# Patient Record
Sex: Female | Born: 1980 | Race: White | Hispanic: No | Marital: Single
Health system: Southern US, Community
[De-identification: ages and names within clinical notes are randomized; demographics above are authoritative.]

## PROBLEM LIST (undated history)

## (undated) DIAGNOSIS — S82892A Other fracture of left lower leg, initial encounter for closed fracture: Secondary | ICD-10-CM

## (undated) DIAGNOSIS — F32A Depression, unspecified: Secondary | ICD-10-CM

## (undated) DIAGNOSIS — I509 Heart failure, unspecified: Secondary | ICD-10-CM

## (undated) DIAGNOSIS — I10 Essential (primary) hypertension: Secondary | ICD-10-CM

## (undated) DIAGNOSIS — F319 Bipolar disorder, unspecified: Secondary | ICD-10-CM

## (undated) DIAGNOSIS — E559 Vitamin D deficiency, unspecified: Secondary | ICD-10-CM

## (undated) DIAGNOSIS — K760 Fatty (change of) liver, not elsewhere classified: Secondary | ICD-10-CM

## (undated) DIAGNOSIS — E669 Obesity, unspecified: Secondary | ICD-10-CM

## (undated) DIAGNOSIS — K219 Gastro-esophageal reflux disease without esophagitis: Secondary | ICD-10-CM

## (undated) DIAGNOSIS — G43909 Migraine, unspecified, not intractable, without status migrainosus: Secondary | ICD-10-CM

## (undated) DIAGNOSIS — F329 Major depressive disorder, single episode, unspecified: Secondary | ICD-10-CM

## (undated) HISTORY — PX: TUBAL LIGATION: SHX77

## (undated) HISTORY — PX: MOUTH SURGERY: SHX715

## (undated) HISTORY — PX: OTHER SURGICAL HISTORY: SHX169

## (undated) HISTORY — PX: LYMPH GLAND EXCISION: SHX13

## (undated) HISTORY — PX: DILATION AND CURETTAGE OF UTERUS: SHX78

---

## 2004-10-05 ENCOUNTER — Ambulatory Visit (HOSPITAL_COMMUNITY): Admission: RE | Admit: 2004-10-05 | Discharge: 2004-10-05 | Payer: Self-pay | Admitting: Obstetrics and Gynecology

## 2004-10-05 ENCOUNTER — Other Ambulatory Visit: Admission: RE | Admit: 2004-10-05 | Discharge: 2004-10-05 | Payer: Self-pay | Admitting: Obstetrics and Gynecology

## 2010-04-12 ENCOUNTER — Emergency Department: Payer: Self-pay | Admitting: Emergency Medicine

## 2011-03-06 ENCOUNTER — Emergency Department: Payer: Self-pay | Admitting: Emergency Medicine

## 2011-05-10 ENCOUNTER — Encounter (HOSPITAL_COMMUNITY): Payer: Self-pay | Admitting: Emergency Medicine

## 2011-05-10 ENCOUNTER — Emergency Department (HOSPITAL_COMMUNITY): Payer: Self-pay

## 2011-05-10 ENCOUNTER — Emergency Department (HOSPITAL_COMMUNITY)
Admission: EM | Admit: 2011-05-10 | Discharge: 2011-05-11 | Disposition: A | Discharge status: Home / Self Care | Payer: Self-pay | Attending: Emergency Medicine | Admitting: Emergency Medicine

## 2011-05-10 DIAGNOSIS — S39012A Strain of muscle, fascia and tendon of lower back, initial encounter: Secondary | ICD-10-CM

## 2011-05-10 DIAGNOSIS — Y92009 Unspecified place in unspecified non-institutional (private) residence as the place of occurrence of the external cause: Secondary | ICD-10-CM | POA: Insufficient documentation

## 2011-05-10 DIAGNOSIS — S335XXA Sprain of ligaments of lumbar spine, initial encounter: Secondary | ICD-10-CM | POA: Insufficient documentation

## 2011-05-10 DIAGNOSIS — W010XXA Fall on same level from slipping, tripping and stumbling without subsequent striking against object, initial encounter: Secondary | ICD-10-CM | POA: Insufficient documentation

## 2011-05-10 DIAGNOSIS — M545 Low back pain, unspecified: Secondary | ICD-10-CM | POA: Insufficient documentation

## 2011-05-10 HISTORY — DX: Essential (primary) hypertension: I10

## 2011-05-10 NOTE — ED Notes (Signed)
PT. 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 PAIN WORSE WITH MOVEMENT AND CERTAIN POSITIONS.

## 2011-05-11 ENCOUNTER — Encounter (HOSPITAL_COMMUNITY): Payer: Self-pay | Admitting: Emergency Medicine

## 2011-05-11 MED ORDER — OXYCODONE-ACETAMINOPHEN 5-325 MG PO TABS: ORAL_TABLET | ORAL | Status: DC

## 2011-05-11 MED ORDER — OXYCODONE-ACETAMINOPHEN 5-325 MG PO TABS
1.0000 | ORAL_TABLET | Freq: Once | ORAL | Status: AC
  Administered 2011-05-11: 1 via ORAL
  Filled 2011-05-11: qty 1

## 2011-05-11 NOTE — Discharge Instructions (Signed)
Back Pain, Adult Low back pain is very common. About 1 in 5 people have back pain.The cause of low back pain is rarely dangerous. The pain often gets better over time.About half of people with a sudden onset of back pain feel better in just 2 weeks. About 8 in 10 people feel better by 6 weeks.  CAUSES Some common causes of back pain include:  Strain of the muscles or ligaments supporting the spine.   Wear and tear (degeneration) of the spinal discs.   Arthritis.   Direct injury to the back.  DIAGNOSIS Most of the time, the direct cause of low back pain is not known.However, back pain can be treated effectively even when the exact cause of the pain is unknown.Answering your caregiver's questions about your overall health and symptoms is one of the most accurate ways to make sure the cause of your pain is not dangerous. If your caregiver needs more information, he or she may order lab work or imaging tests (X-rays or MRIs).However, even if imaging tests show changes in your back, this usually does not require surgery. HOME CARE INSTRUCTIONS For many people, back pain returns.Since low back pain is rarely dangerous, it is often a condition that people can learn to manageon their own.   Remain active. It is stressful on the back to sit or stand in one place. Do not sit, drive, or stand in one place for more than 30 minutes at a time. Take short walks on level surfaces as soon as pain allows.Try to increase the length of time you walk each day.   Do not stay in bed.Resting more than 1 or 2 days can delay your recovery.   Do not avoid exercise or work.Your body is made to move.It is not dangerous to be active, even though your back may hurt.Your back will likely heal faster if you return to being active before your pain is gone.   Pay attention to your body when you bend and lift. Many people have less discomfortwhen lifting if they bend their knees, keep the load close to their  bodies,and avoid twisting. Often, the most comfortable positions are those that put less stress on your recovering back.   Find a comfortable position to sleep. Use a firm mattress and lie on your side with your knees slightly bent. If you lie on your back, put a pillow under your knees.   Only take over-the-counter or prescription medicines as directed by your caregiver. Over-the-counter medicines to reduce pain and inflammation are often the most helpful.Your caregiver may prescribe muscle relaxant drugs.These medicines help dull your pain so you can more quickly return to your normal activities and healthy exercise.   Put ice on the injured area.   Put ice in a plastic bag.   Place a towel between your skin and the bag.   Leave the ice on for 15 to 20 minutes, 3 to 4 times a day for the first 2 to 3 days. After that, ice and heat may be alternated to reduce pain and spasms.   Ask your caregiver about trying back exercises and gentle massage. This may be of some benefit.   Avoid feeling anxious or stressed.Stress increases muscle tension and can worsen back pain.It is important to recognize when you are anxious or stressed and learn ways to manage it.Exercise is a great option.  SEEK MEDICAL CARE IF:  You have pain that is not relieved with rest or medicine.   You have   pain that does not improve in 1 week.   You have new symptoms.   You are generally not feeling well.  SEEK IMMEDIATE MEDICAL CARE IF:   You have pain that radiates from your back into your legs.   You develop new bowel or bladder control problems.   You have unusual weakness or numbness in your arms or legs.   You develop nausea or vomiting.   You develop abdominal pain.   You feel faint.  Document Released: 01/31/2005 Document Revised: 01/20/2011 Document Reviewed: 06/21/2010 [facility] Patient Information 2012 [place], [place].    Narcotic and benzodiazepine use may cause drowsiness, slowed breathing  or dependence.  Please use with caution and do not drive, operate machinery or watch young children alone while taking them.  Taking combinations of these medications or drinking alcohol will potentiate these effects.  Be sure to take Aleve or ibuprofen along with food for the next 5 days, ice packs and heating pads will also help alleviate muscle spasms and pain as well.  Be sure to perform stretching and strength exercises once you are able to to help strengthen your back to help prevent future problems.

## 2011-05-11 NOTE — ED Notes (Signed)
Awaiting patient's ride to release patient.

## 2011-05-11 NOTE — ED Provider Notes (Signed)
History     CSN: [id number]  Arrival date & time 05/10/11  2135   First MD Initiated Contact with Patient 05/11/11 [phone]      Chief Complaint  Patient presents with  . Back Pain    (Consider location/radiation/quality/duration/timing/severity/associated sxs/prior treatment) HPI Comments: Patient reports about 6 PM tonight she was in the kitchen and possibly do to water on the floor she slipped. She reports she landed right on her tailbone. She does have a history of some chronic back problems for which he takes when necessary Percocet. She is to see Dr. Ethelene Hal most with [facility] orth[facility] for her back pain. She reports that due to her insurance going out she has not seen him in a while. She reports she has done her last Percocet and took her one last Percocet prior to coming to the emergency department. Here she reports the pain medication as well as started to kick in and she is more comfortable. She denies any new numbness or weakness. She is able to sit up and said right on her tailbone without significant pain but has more diffuse low back pain. She denies any urinary or bowel incontinence. She denies injuring anything else from her fall. She was able to stand up and ambulate although movement of her low back seem to make the pain worse.  Patient is a 31 y.o. female presenting with back pain. The history is provided by the patient.  Back Pain  Pertinent negatives include no chest pain, no numbness, no abdominal pain and no weakness.    Past Medical History  Diagnosis Date  . Asthma   . Hypertension     Past Surgical History  Procedure Date  . Tubal ligation     History reviewed. No pertinent family history.  History  Substance Use Topics  . Smoking status: Never Smoker   . Smokeless tobacco: Not on file  . Alcohol Use: No    OB History    Grav Para Term Preterm Abortions TAB SAB Ect Mult Living                  Review of Systems  Cardiovascular: Negative for  chest pain.  Gastrointestinal: Negative for abdominal pain.  Genitourinary: Negative for difficulty urinating.  Musculoskeletal: Positive for back pain.  Skin: Negative for wound.  Neurological: Negative for weakness and numbness.  All other systems reviewed and are negative.    Allergies  Review of patient's allergies indicates no known allergies.  Home Medications   Current Outpatient Rx  Name Route Sig Dispense Refill  . OXYCODONE-ACETAMINOPHEN 5-325 MG PO TABS  1-2 tablets po q 6 hours prn moderate to severe pain 15 tablet 0    BP 174/100  Pulse 70  Temp(Src) 98.3 F (36.8 C) (Oral)  Resp 19  Ht 5\' 4"  (1.626 m)  Wt 218 lb (98.884 kg)  BMI 37.42 kg/m2  SpO2 100%  LMP 05/07/2011  Physical Exam  Nursing note and vitals reviewed. Constitutional: She appears well-developed and well-nourished.  Non-toxic appearance. She does not have a sickly appearance. She does not appear ill.       Patient is sitting up in bed straight in no apparent distress. Her voice is somewhat thick associated indeed taken her Percocet as she described in her history. However she is very awake, alert, oriented.  Neck: Normal range of motion. Neck supple. No spinous process tenderness present. No thyromegaly present.  Musculoskeletal:       Thoracic back: She exhibits  no tenderness, no bony tenderness, no swelling, no edema, no deformity and no spasm.       Lumbar back: She exhibits decreased range of motion, tenderness and pain. She exhibits no bony tenderness, no swelling, no edema, no deformity and no spasm.  Neurological: She is alert. She has normal strength and normal reflexes. GCS eye subscore is 4. GCS verbal subscore is 5. GCS motor subscore is 6.    ED Course  Procedures (including critical care time)  Labs Reviewed - No data to display Dg Lumbar Spine Complete  05/10/2011  *RADIOLOGY REPORT*  Clinical Data: Status post fall; right lower back pain.  LUMBAR SPINE - COMPLETE 4+ VIEW   Comparison: None.  Findings: There is no evidence of acute fracture or subluxation. There appear to be chronic bilateral pars defects at L5, without evidence of anterolisthesis.  Vertebral bodies demonstrate normal height and alignment.  Intervertebral disc spaces are preserved.  The visualized bowel gas pattern is unremarkable in appearance; air and stool are noted within the colon.  The sacroiliac joints are within normal limits.  IMPRESSION:  1.  No evidence of acute fracture or subluxation along the lumbar spine. 2.  Chronic bilateral pars defects at L5, without evidence of anterolisthesis.  Original Report Authenticated By: Tonia Ghent, M.D.   I reviewed the above films myself  1. Lumbar strain       MDM   Patient's initial hypertension here likely was due to pain. She appears much more comfortable at this time. I am not concerned for spinal cord or distal neurologic issue. I do not feel plain films are indicated however by protocol plain films were are reviewed done. No fracture seen on plain films by myself or by radiologist. Patient is given a refill of her prescription for Percocet and she is told to use heating an ice packs as well as NSAIDs. She is told to followup with Dr. Ethelene Hal for recheck if things are not improving.        Gavin Pound. Oletta Lamas, MD 05/11/11 1610

## 2011-05-11 NOTE — ED Notes (Signed)
MD at bedside. 

## 2011-05-11 NOTE — ED Notes (Signed)
Patient is AOx4 and comfortable with her discharge instructions. 

## 2011-10-03 ENCOUNTER — Emergency Department: Payer: Self-pay | Admitting: Emergency Medicine

## 2011-12-17 ENCOUNTER — Emergency Department: Payer: Self-pay | Admitting: Emergency Medicine

## 2012-04-23 ENCOUNTER — Encounter (HOSPITAL_COMMUNITY): Payer: Self-pay | Admitting: *Deleted

## 2012-04-23 ENCOUNTER — Emergency Department (HOSPITAL_COMMUNITY): Payer: Self-pay

## 2012-04-23 ENCOUNTER — Emergency Department (HOSPITAL_COMMUNITY)
Admission: EM | Admit: 2012-04-23 | Discharge: 2012-04-23 | Disposition: A | Discharge status: Home / Self Care | Payer: Self-pay | Attending: Emergency Medicine | Admitting: Emergency Medicine

## 2012-04-23 DIAGNOSIS — I10 Essential (primary) hypertension: Secondary | ICD-10-CM | POA: Insufficient documentation

## 2012-04-23 DIAGNOSIS — Z3202 Encounter for pregnancy test, result negative: Secondary | ICD-10-CM | POA: Insufficient documentation

## 2012-04-23 DIAGNOSIS — N83209 Unspecified ovarian cyst, unspecified side: Secondary | ICD-10-CM | POA: Insufficient documentation

## 2012-04-23 DIAGNOSIS — J45909 Unspecified asthma, uncomplicated: Secondary | ICD-10-CM | POA: Insufficient documentation

## 2012-04-23 LAB — URINALYSIS, ROUTINE W REFLEX MICROSCOPIC
Bilirubin Urine: NEGATIVE
Hgb urine dipstick: NEGATIVE
Ketones, ur: NEGATIVE mg/dL
Specific Gravity, Urine: 1.017 (ref 1.005–1.030)
pH: 6 (ref 5.0–8.0)

## 2012-04-23 LAB — COMPREHENSIVE METABOLIC PANEL
AST: 17 U/L (ref 0–37)
BUN: 10 mg/dL (ref 6–23)
CO2: 23 mEq/L (ref 19–32)
Calcium: 10.3 mg/dL (ref 8.4–10.5)
Chloride: 106 mEq/L (ref 96–112)
Creatinine, Ser: 0.74 mg/dL (ref 0.50–1.10)
GFR calc Af Amer: >90 mL/min (ref >90)
GFR calc non Af Amer: >90 mL/min (ref >90)
Potassium: 3.5 mEq/L (ref 3.5–5.1)
Total Bilirubin: 0.4 mg/dL (ref 0.3–1.2)
Total Protein: 7.2 g/dL (ref 6.0–8.3)

## 2012-04-23 LAB — CBC WITH DIFFERENTIAL/PLATELET
Eosinophils Relative: 3 % (ref 0–5)
Lymphs Abs: 2.7 10*3/uL (ref 0.7–4.0)
MCHC: 36 g/dL (ref 30.0–36.0)
Monocytes Absolute: 0.5 10*3/uL (ref 0.1–1.0)
Monocytes Relative: 7 % (ref 3–12)
Platelets: 205 10*3/uL (ref 150–400)
RBC: 4.37 MIL/uL (ref 3.87–5.11)
RDW: 12.6 % (ref 11.5–15.5)

## 2012-04-23 LAB — POCT PREGNANCY, URINE: Preg Test, Ur: NEGATIVE

## 2012-04-23 MED ORDER — OXYCODONE-ACETAMINOPHEN 5-325 MG PO TABS
2.0000 | ORAL_TABLET | Freq: Once | ORAL | Status: AC
  Administered 2012-04-23: 2 via ORAL
  Filled 2012-04-23: qty 2

## 2012-04-23 MED ORDER — OXYCODONE-ACETAMINOPHEN 5-325 MG PO TABS: 1.0000 | ORAL_TABLET | Freq: Four times a day (QID) | ORAL | Status: DC | PRN

## 2012-04-23 NOTE — ED Notes (Signed)
Patient woke up this morning with pelvic pain that radiates to her back.  Patient took ibuprofen and it did not help. The pain got worse and she decided to come in to the ED.

## 2012-04-23 NOTE — ED Notes (Signed)
Patient is alert and orientedx4.  Patient was explained discharge instructions and they understood them with no questions.  Paggy Reva Bores, patient's mom is here to take her home.

## 2012-04-23 NOTE — ED Notes (Signed)
Pt is here with LLQ pain that radiates across lower stomach and to lower back that started this am.

## 2012-04-23 NOTE — ED Provider Notes (Signed)
Medical screening examination/treatment/procedure(s) were performed by non-physician practitioner and as supervising physician I was immediately available for consultation/collaboration.  Toy Baker, MD 04/23/12 [phone]

## 2012-04-23 NOTE — ED Notes (Signed)
Patient back from CT.

## 2012-04-23 NOTE — ED Provider Notes (Signed)
History     CSN: [id number]  Arrival date & time 04/23/12  1252   First MD Initiated Contact with Patient 04/23/12 1522      Chief Complaint  Patient presents with  . Abdominal Pain    (Consider location/radiation/quality/duration/timing/severity/associated sxs/prior treatment) HPI Comments: 32 year old female presents emergency department complaining of sudden onset left lower quadrant pain x1 day. States around 10:00 this morning she was putting on a sweatshirt when the pain began. Describes the [place] as [place] pain rated 9/10 radiating around her left flank to the left side of her back.. Nothing in specific makes the pain worse or better. She tried taking ibuprofen without any relief. Denies associated nausea, vomiting, fever or chills, vaginal bleeding or discharge, increased urinary frequency, urgency or hematuria. Last menstrual period was 2 weeks ago and was normal. She has a history of a tubal ligation back in 2011. States her bowel movements have been normal. Denies constipation or diarrhea.  Patient is a 32 y.o. female presenting with abdominal pain. The history is provided by the patient.  Abdominal Pain Associated symptoms: no chest pain, no chills, no constipation, no diarrhea, no dysuria, no fever, no hematuria, no nausea, no shortness of breath, no vaginal bleeding, no vaginal discharge and no vomiting     Past Medical History  Diagnosis Date  . Asthma   . Hypertension     Past Surgical History  Procedure Laterality Date  . Tubal ligation      No family history on file.  History  Substance Use Topics  . Smoking status: Never Smoker   . Smokeless tobacco: Not on file  . Alcohol Use: No    OB History   Grav Para Term Preterm Abortions TAB SAB Ect Mult Living                  Review of Systems  Constitutional: Negative for fever, chills and diaphoresis.  HENT: Negative for neck pain and neck stiffness.   Respiratory:  Negative for shortness of breath.   Cardiovascular: Negative for chest pain.  Gastrointestinal: Positive for abdominal pain. Negative for nausea, vomiting, diarrhea, constipation and blood in stool.  Genitourinary: Positive for flank pain. Negative for dysuria, urgency, frequency, hematuria, vaginal bleeding, vaginal discharge, menstrual problem and pelvic pain.  Musculoskeletal: Positive for back pain.  All other systems reviewed and are negative.    Allergies  Review of patient's allergies indicates no known allergies.  Home Medications   Current Outpatient Rx  Name  Route  Sig  Dispense  Refill  . oxyCODONE-acetaminophen (PERCOCET) 5-325 MG per tablet      1-2 tablets po q 6 hours prn moderate to severe pain   15 tablet   0     BP 170/117  Pulse 97  Temp(Src) 97.4 F (36.3 C) (Oral)  Resp 18  SpO2 96%  Physical Exam  Nursing note and vitals reviewed. Constitutional: She is oriented to person, place, and time. She appears well-developed and well-nourished. No distress.  Overweight  HENT:  Head: Normocephalic and atraumatic.  Mouth/Throat: Oropharynx is clear and moist.  Eyes: Conjunctivae and EOM are normal.  Neck: Normal range of motion. Neck supple.  Cardiovascular: Normal rate, regular rhythm, normal heart sounds and intact distal pulses.   Pulmonary/Chest: Effort normal and breath sounds normal. No respiratory distress.  Abdominal: Soft. Normal appearance and bowel sounds are normal. She exhibits no mass. There is tenderness in the left upper quadrant and  left lower quadrant. There is guarding and CVA tenderness (left). There is no rigidity and no rebound.  Musculoskeletal: Normal range of motion. She exhibits no edema.  Neurological: She is alert and oriented to person, place, and time.  Skin: Skin is warm and dry.  Psychiatric: She has a normal mood and affect. Her behavior is normal.    ED Course  Procedures (including critical care time)  Labs Reviewed   CBC WITH DIFFERENTIAL  COMPREHENSIVE METABOLIC PANEL  LIPASE, BLOOD  URINALYSIS, ROUTINE W REFLEX MICROSCOPIC   Ct Abdomen Pelvis Wo Contrast  04/23/2012  *RADIOLOGY REPORT*  Clinical Data: Left lower quadrant abdominal pain radiating to the lower back.  CT ABDOMEN AND PELVIS WITHOUT CONTRAST  Technique:  Multidetector CT imaging of the abdomen and pelvis was performed following the standard protocol without intravenous contrast.  Comparison: 05/10/2011 radiographs  Findings: Linear subsegmental atelectasis is present in the posted basal segments of both lower lobes. The visualized portion of the liver, spleen, pancreas, and adrenal glands appear unremarkable in noncontrast CT appearance.  The gallbladder and biliary system appear unremarkable.  The kidneys appear unremarkable, as do the proximal ureters.  Appendix normal.  Descending and sigmoid colon unremarkable.  No pathologic pelvic adenopathy is identified.  Hypodense left adnexal structure measuring up to 4.3 x 3.5 cm, query ovarian cyst.  Bilateral pars defects noted at L5, chronic and without associated anterolisthesis.  IMPRESSION:  1.  Hypodense left adnexal/ovarian lesion.  This may represent a cyst but is technically nonspecific.  Given the patient's left lower quadrant pain, pelvic sonography should be considered for further workup. 2.  Bilateral pars defects at L5, without anterolisthesis.   Original Report Authenticated By: Gaylyn Rong, M.D.    US Transvaginal Non-ob  04/23/2012  *RADIOLOGY REPORT*  Clinical Data:  Abdominal pain, left lower quadrant pain, prior tubal ligation  TRANSABDOMINAL AND TRANSVAGINAL ULTRASOUND OF PELVIS DOPPLER ULTRASOUND OF OVARIES  Technique:  Both transabdominal and transvaginal ultrasound examinations of the pelvis were performed. Transabdominal technique was performed for global imaging of the pelvis including uterus, ovaries, adnexal regions, and pelvic cul-de-sac.  It was necessary to proceed with  endovaginal exam following the transabdominal exam to visualize the ovaries.  Color and duplex Doppler ultrasound was utilized to evaluate blood flow to the ovaries.  Comparison:  None  Findings:  Uterus:  10.2 cm length by 4.9 cm AP by 6.8 cm transverse.  Normal morphology without mass.  Endometrium:  5 mm thick, normal.  No endometrial fluid.  Right ovary: 2.8 x 2.9 x 2.3 cm.  Normal morphology without mass. Internal blood flow present on color Doppler imaging.  Left ovary:   5.9 x 3.3 x 5.3 cm.  Complex hypoechoic lesion 3.6 x 2.7 x 3.9 cm in size question complicated/hemorrhagic cyst.  No definite mural nodularity.  Blood flow present within left ovary on color Doppler imaging.  Pulsed Doppler evaluation demonstrates normal low-resistance arterial and venous waveforms in both ovaries.  Small amount nonspecific free pelvic fluid.  IMPRESSION: 3.6 x 2.7 x 3.9 cm diameter complicated / hemorrhagic cyst within left ovary. Otherwise negative exam. No sonographic evidence for ovarian torsion.   Original Report Authenticated By: Ulyses Southward, M.D.    US Pelvis Complete  04/23/2012  *RADIOLOGY REPORT*  Clinical Data:  Abdominal pain, left lower quadrant pain, prior tubal ligation  TRANSABDOMINAL AND TRANSVAGINAL ULTRASOUND OF PELVIS DOPPLER ULTRASOUND OF OVARIES  Technique:  Both transabdominal and transvaginal ultrasound examinations of the pelvis were performed. Transabdominal technique  was performed for global imaging of the pelvis including uterus, ovaries, adnexal regions, and pelvic cul-de-sac.  It was necessary to proceed with endovaginal exam following the transabdominal exam to visualize the ovaries.  Color and duplex Doppler ultrasound was utilized to evaluate blood flow to the ovaries.  Comparison:  None  Findings:  Uterus:  10.2 cm length by 4.9 cm AP by 6.8 cm transverse.  Normal morphology without mass.  Endometrium:  5 mm thick, normal.  No endometrial fluid.  Right ovary: 2.8 x 2.9 x 2.3 cm.  Normal  morphology without mass. Internal blood flow present on color Doppler imaging.  Left ovary:   5.9 x 3.3 x 5.3 cm.  Complex hypoechoic lesion 3.6 x 2.7 x 3.9 cm in size question complicated/hemorrhagic cyst.  No definite mural nodularity.  Blood flow present within left ovary on color Doppler imaging.  Pulsed Doppler evaluation demonstrates normal low-resistance arterial and venous waveforms in both ovaries.  Small amount nonspecific free pelvic fluid.  IMPRESSION: 3.6 x 2.7 x 3.9 cm diameter complicated / hemorrhagic cyst within left ovary. Otherwise negative exam. No sonographic evidence for ovarian torsion.   Original Report Authenticated By: Ulyses Southward, M.D.    [place] Art/ven Flow Abd Pelv Doppler  04/23/2012  *RADIOLOGY REPORT*  Clinical Data:  Abdominal pain, left lower quadrant pain, prior tubal ligation  TRANSABDOMINAL AND TRANSVAGINAL ULTRASOUND OF PELVIS DOPPLER ULTRASOUND OF OVARIES  Technique:  Both transabdominal and transvaginal ultrasound examinations of the pelvis were performed. Transabdominal technique was performed for global imaging of the pelvis including uterus, ovaries, adnexal regions, and pelvic cul-de-sac.  It was necessary to proceed with endovaginal exam following the transabdominal exam to visualize the ovaries.  Color and duplex Doppler ultrasound was utilized to evaluate blood flow to the ovaries.  Comparison:  None  Findings:  Uterus:  10.2 cm length by 4.9 cm AP by 6.8 cm transverse.  Normal morphology without mass.  Endometrium:  5 mm thick, normal.  No endometrial fluid.  Right ovary: 2.8 x 2.9 x 2.3 cm.  Normal morphology without mass. Internal blood flow present on color Doppler imaging.  Left ovary:   5.9 x 3.3 x 5.3 cm.  Complex hypoechoic lesion 3.6 x 2.7 x 3.9 cm in size question complicated/hemorrhagic cyst.  No definite mural nodularity.  Blood flow present within left ovary on color Doppler imaging.  Pulsed Doppler evaluation demonstrates normal low-resistance arterial and  venous waveforms in both ovaries.  Small amount nonspecific free pelvic fluid.  IMPRESSION: 3.6 x 2.7 x 3.9 cm diameter complicated / hemorrhagic cyst within left ovary. Otherwise negative exam. No sonographic evidence for ovarian torsion.   Original Report Authenticated By: Ulyses Southward, M.D.      1. Ovarian cyst       MDM  Possible kidney stones- pain improved with percocet. Labs drawn in triage prior to evaluation. U/A not concerning. Obtaining CT scan. 6:40 PM CT scan negative for stone, however hypodense lesion on L ovary which may represent cyst present. Obtaining pelvic US. 8:59 PM Pelvic US positive for left complicated/hemorrhagic ovarian cyst. No torsion. She does not have an OB/GYN and will f/u at [facility]. Rx percocet. Return precautions discussed. She is in NAD. Stable for discharge. Patient states understanding of plan and is agreeable.   Trevor Mace, PA-C 04/23/12 2100

## 2012-04-25 ENCOUNTER — Ambulatory Visit (INDEPENDENT_AMBULATORY_CARE_PROVIDER_SITE_OTHER): Payer: Self-pay | Admitting: Obstetrics & Gynecology

## 2012-04-25 ENCOUNTER — Encounter: Payer: Self-pay | Admitting: Obstetrics & Gynecology

## 2012-04-25 VITALS — BP 150/99 | HR 68 | Temp 97.8°F | Ht 64.0 in | Wt 207.0 lb

## 2012-04-25 DIAGNOSIS — R102 Pelvic and perineal pain: Secondary | ICD-10-CM | POA: Insufficient documentation

## 2012-04-25 DIAGNOSIS — N949 Unspecified condition associated with female genital organs and menstrual cycle: Secondary | ICD-10-CM

## 2012-04-25 DIAGNOSIS — N83209 Unspecified ovarian cyst, unspecified side: Secondary | ICD-10-CM

## 2012-04-25 LAB — URINE CULTURE

## 2012-04-25 MED ORDER — OXYCODONE-ACETAMINOPHEN 5-325 MG PO TABS: 1.0000 | ORAL_TABLET | Freq: Four times a day (QID) | ORAL | Status: DC | PRN

## 2012-04-25 MED ORDER — PROMETHAZINE HCL 25 MG PO TABS: 25.0000 mg | ORAL_TABLET | Freq: Four times a day (QID) | ORAL | Status: DC | PRN

## 2012-04-25 NOTE — Patient Instructions (Addendum)
Ovarian Cyst The ovaries are small organs that are on each side of the uterus. The ovaries are the organs that produce the female hormones, estrogen and progesterone. An ovarian cyst is a sac filled with fluid that can vary in its size. It is normal for a small cyst to form in women who are in the childbearing age and who have menstrual periods. This type of cyst is called a follicle cyst that becomes an ovulation cyst (corpus luteum cyst) after it produces the women's egg. It later goes away on its own if the woman does not become pregnant. There are other kinds of ovarian cysts that may cause problems and may need to be treated. The most serious problem is a cyst with cancer. It should be noted that menopausal women who have an ovarian cyst are at a higher risk of it being a cancer cyst. They should be evaluated very quickly, thoroughly and followed closely. This is especially true in menopausal women because of the high rate of ovarian cancer in women in menopause. CAUSES AND TYPES OF OVARIAN CYSTS:  FUNCTIONAL CYST: The follicle/corpus luteum cyst is a functional cyst that occurs every month during ovulation with the menstrual cycle. They go away with the next menstrual cycle if the woman does not get pregnant. Usually, there are no symptoms with a functional cyst.  ENDOMETRIOMA CYST: This cyst develops from the lining of the uterus tissue. This cyst gets in or on the ovary. It grows every month from the bleeding during the menstrual period. It is also called a "chocolate cyst" because it becomes filled with blood that turns brown. This cyst can cause pain in the lower abdomen during intercourse and with your menstrual period.  CYSTADENOMA CYST: This cyst develops from the cells on the outside of the ovary. They usually are not cancerous. They can get very big and cause lower abdomen pain and pain with intercourse. This type of cyst can twist on itself, cut off its blood supply and cause severe pain. It  also can easily rupture and cause a lot of pain.  DERMOID CYST: This type of cyst is sometimes found in both ovaries. They are found to have different kinds of body tissue in the cyst. The tissue includes skin, teeth, hair, and/or cartilage. They usually do not have symptoms unless they get very big. Dermoid cysts are rarely cancerous.  POLYCYSTIC OVARY: This is a rare condition with hormone problems that produces many small cysts on both ovaries. The cysts are follicle-like cysts that never produce an egg and become a corpus luteum. It can cause an increase in body weight, infertility, acne, increase in body and facial hair and lack of menstrual periods or rare menstrual periods. Many women with this problem develop type 2 diabetes. The exact cause of this problem is unknown. A polycystic ovary is rarely cancerous.  THECA LUTEIN CYST: Occurs when too much hormone (human chorionic gonadotropin) is produced and over-stimulates the ovaries to produce an egg. They are frequently seen when doctors stimulate the ovaries for invitro-fertilization (test tube babies).  LUTEOMA CYST: This cyst is seen during pregnancy. Rarely it can cause an obstruction to the birth canal during labor and delivery. They usually go away after delivery. SYMPTOMS   Pelvic pain or pressure.  Pain during sexual intercourse.  Increasing girth (swelling) of the abdomen.  Abnormal menstrual periods.  Increasing pain with menstrual periods.  You stop having menstrual periods and you are not pregnant. DIAGNOSIS  The diagnosis can   be made during:  Routine or annual pelvic examination (common).  Ultrasound.  X-ray of the pelvis.  CT Scan.  MRI.  Blood tests. TREATMENT   Treatment may only be to follow the cyst monthly for 2 to 3 months with your caregiver. Many go away on their own, especially functional cysts.  May be aspirated (drained) with a long needle with ultrasound, or by laparoscopy (inserting a tube into  the pelvis through a small incision).  The whole cyst can be removed by laparoscopy.  Sometimes the cyst may need to be removed through an incision in the lower abdomen.  Hormone treatment is sometimes used to help dissolve certain cysts.  Birth control pills are sometimes used to help dissolve certain cysts. HOME CARE INSTRUCTIONS  Follow your caregiver's advice regarding:  Medicine.  Follow up visits to evaluate and treat the cyst.  You may need to come back or make an appointment with another caregiver, to find the exact cause of your cyst, if your caregiver is not a gynecologist.  Get your yearly and recommended pelvic examinations and Pap tests.  Let your caregiver know if you have had an ovarian cyst in the past. SEEK MEDICAL CARE IF:   Your periods are late, irregular, they stop, or are painful.  Your stomach (abdomen) or pelvic pain does not go away.  Your stomach becomes larger or swollen.  You have pressure on your bladder or trouble emptying your bladder completely.  You have painful sexual intercourse.  You have feelings of fullness, pressure, or discomfort in your stomach.  You lose weight for no apparent reason.  You feel generally ill.  You become constipated.  You lose your appetite.  You develop acne.  You have an increase in body and facial hair.  You are gaining weight, without changing your exercise and eating habits.  You think you are pregnant. SEEK IMMEDIATE MEDICAL CARE IF:   You have increasing abdominal pain.  You feel sick to your stomach (nausea) and/or vomit.  You develop a fever that comes on suddenly.  You develop abdominal pain during a bowel movement.  Your menstrual periods become heavier than usual. Document Released: 01/31/2005 Document Revised: 04/25/2011 Document Reviewed: 12/04/2008 [employer] Patient Information 2013 [employer].  

## 2012-04-25 NOTE — Progress Notes (Signed)
Patient ID: Andrea Harmon, female   DOB: Jan 19, 1981, 32 y.o.   MRN: [id number]  Chief Complaint  Patient presents with  . Ovarian Cyst    HPI Andrea Harmon is a 32 y.o. female.  Patient's last menstrual period was 04/02/2012. [id number] Sudden onset of LLQ pain 3/10 and seen in ED, renal stone ruled out, left ovarian cyst identified. Still with pain which responds to percocet, 2 tab at a time. Previously used percocet for back issues but was not using at the time of onset of sx. HPI  Past Medical History  Diagnosis Date  . Asthma   . Hypertension     Past Surgical History  Procedure Laterality Date  . Tubal ligation      Family History  Problem Relation Age of Onset  . Hypertension Mother   . Cancer Maternal Grandfather     pancreas    Social History History  Substance Use Topics  . Smoking status: Never Smoker   . Smokeless tobacco: Never Used  . Alcohol Use: No    No Known Allergies  Current Outpatient Prescriptions  Medication Sig Dispense Refill  . ibuprofen (ADVIL,MOTRIN) 200 MG tablet Take 800 mg by mouth every 6 (six) hours as needed for pain.      Marland Kitchen oxyCODONE-acetaminophen (PERCOCET) 5-325 MG per tablet Take 1-2 tablets by mouth every 6 (six) hours as needed for pain.  20 tablet  0   No current facility-administered medications for this visit.    Review of Systems Review of Systems  Constitutional: Negative for fever.  Gastrointestinal: Positive for nausea. Negative for vomiting, diarrhea and abdominal distention.  Genitourinary: Positive for pelvic pain. Negative for vaginal bleeding and vaginal discharge.    Blood pressure 150/99, pulse 68, temperature 97.8 F (36.6 C), height 5\' 4"  (1.626 m), weight 207 lb (93.895 kg), last menstrual period 04/02/2012.  Physical Exam Physical Exam  Constitutional: She is oriented to person, place, and time. She appears well-developed. She appears distressed (mild discomfort).  obese  Pulmonary/Chest: Effort  normal. No respiratory distress.  Abdominal: Soft. She exhibits no mass. There is no tenderness. There is no guarding.  Genitourinary: Vagina normal and uterus normal.  Minimal left tenderness no masses  Neurological: She is alert and oriented to person, place, and time.  Skin: Skin is warm and dry.  Psychiatric: She has a normal mood and affect. Her behavior is normal.    Data Reviewed Study Result    *RADIOLOGY REPORT*  Clinical Data: Abdominal pain, left lower quadrant pain, prior  tubal ligation  TRANSABDOMINAL AND TRANSVAGINAL ULTRASOUND OF PELVIS  DOPPLER ULTRASOUND OF OVARIES  Technique: Both transabdominal and transvaginal ultrasound  examinations of the pelvis were performed. Transabdominal technique  was performed for global imaging of the pelvis including uterus,  ovaries, adnexal regions, and pelvic cul-de-sac.  It was necessary to proceed with endovaginal exam following the  transabdominal exam to visualize the ovaries.  Color and duplex Doppler ultrasound was utilized to evaluate blood  flow to the ovaries.  Comparison: None  Findings:  Uterus: 10.2 cm length by 4.9 cm AP by 6.8 cm transverse. Normal  morphology without mass.  Endometrium: 5 mm thick, normal. No endometrial fluid.  Right ovary: 2.8 x 2.9 x 2.3 cm. Normal morphology without mass.  Internal blood flow present on color Doppler imaging.  Left ovary: 5.9 x 3.3 x 5.3 cm. Complex hypoechoic lesion 3.6 x  2.7 x 3.9 cm in size question complicated/hemorrhagic cyst. No  definite mural  nodularity. Blood flow present within left ovary on  color Doppler imaging.  Pulsed Doppler evaluation demonstrates normal low-resistance  arterial and venous waveforms in both ovaries.  Small amount nonspecific free pelvic fluid.  IMPRESSION:  3.6 x 2.7 x 3.9 cm diameter complicated / hemorrhagic cyst within  left ovary.  Otherwise negative exam.  No sonographic evidence for ovarian torsion.  Original Report  Authenticated By: Ulyses Southward, M.D.      Assessment    Hemorrhagic left ovarian cyst with mittelschmerz     Plan    Percocet 20 tabs no refill Phenergan 25 mg tabs for nausea RTC 3 weeks Report if sx worsen        Rayshaun Needle 04/25/2012, 4:32 PM

## 2012-05-18 ENCOUNTER — Ambulatory Visit: Payer: Self-pay | Admitting: Obstetrics & Gynecology

## 2012-08-19 ENCOUNTER — Emergency Department (HOSPITAL_COMMUNITY)
Admission: EM | Admit: 2012-08-19 | Discharge: 2012-08-19 | Disposition: A | Discharge status: Home / Self Care | Payer: Self-pay | Attending: Emergency Medicine | Admitting: Emergency Medicine

## 2012-08-19 ENCOUNTER — Encounter (HOSPITAL_COMMUNITY): Payer: Self-pay | Admitting: Emergency Medicine

## 2012-08-19 ENCOUNTER — Emergency Department (HOSPITAL_COMMUNITY): Payer: Self-pay

## 2012-08-19 DIAGNOSIS — G8929 Other chronic pain: Secondary | ICD-10-CM | POA: Insufficient documentation

## 2012-08-19 DIAGNOSIS — M25572 Pain in left ankle and joints of left foot: Secondary | ICD-10-CM

## 2012-08-19 DIAGNOSIS — M25579 Pain in unspecified ankle and joints of unspecified foot: Secondary | ICD-10-CM | POA: Insufficient documentation

## 2012-08-19 DIAGNOSIS — Z8781 Personal history of (healed) traumatic fracture: Secondary | ICD-10-CM | POA: Insufficient documentation

## 2012-08-19 DIAGNOSIS — J45909 Unspecified asthma, uncomplicated: Secondary | ICD-10-CM | POA: Insufficient documentation

## 2012-08-19 DIAGNOSIS — M549 Dorsalgia, unspecified: Secondary | ICD-10-CM | POA: Insufficient documentation

## 2012-08-19 DIAGNOSIS — I10 Essential (primary) hypertension: Secondary | ICD-10-CM | POA: Insufficient documentation

## 2012-08-19 HISTORY — DX: Other fracture of left lower leg, initial encounter for closed fracture: S82.892A

## 2012-08-19 MED ORDER — TRAMADOL HCL 50 MG PO TABS: 50.0000 mg | ORAL_TABLET | Freq: Four times a day (QID) | ORAL | Status: DC | PRN

## 2012-08-19 MED ORDER — HYDROCHLOROTHIAZIDE 25 MG PO TABS: 25.0000 mg | ORAL_TABLET | Freq: Every day | ORAL | Status: DC

## 2012-08-19 MED ORDER — TRAMADOL HCL 50 MG PO TABS
50.0000 mg | ORAL_TABLET | Freq: Once | ORAL | Status: AC
  Administered 2012-08-19: 50 mg via ORAL
  Filled 2012-08-19: qty 1

## 2012-08-19 NOTE — ED Notes (Signed)
Ortho tech called for application of ACE wrap.  

## 2012-08-19 NOTE — ED Notes (Signed)
Pt from home c/o L ankle pain. Pt reports that she broke her ankle in November '13 and she has had swelling but the past few days the pain has increased. Pt denies reinjury. Pt has minor swelling to L lateral ankle Pt A&O and in NAD

## 2012-08-19 NOTE — ED Provider Notes (Signed)
History    CSN: [id number] Arrival date & time 08/19/12  1350  First MD Initiated Contact with Patient 08/19/12 1450     Chief Complaint  Patient presents with  . Ankle Pain   (Consider location/radiation/quality/duration/timing/severity/associated sxs/prior Treatment) HPI Comments: Patient p/w left ankle pain x 1 week.  Reports she broke her ankle last November, was in a cast 3 weeks, then used her mother's walking boot.  Is not sure if she saw an orthopedist or primary care provider at [facility].  Denies new injury, change in shoes.  Pain is throbbing, constant, radiates into the lower leg.  Has tried ACE sleeve and RICE and ibuprofen without improvement. No weakness or numbness of the foot.  Denies fevers, chills, body aches.   Patient is a 32 y.o. female presenting with ankle pain. The history is provided by the patient.  Ankle Pain Associated symptoms: back pain   Associated symptoms: no fever    Past Medical History  Diagnosis Date  . Asthma   . Hypertension   . Ankle fracture, left    Past Surgical History  Procedure Laterality Date  . Tubal ligation     Family History  Problem Relation Age of Onset  . Hypertension Mother   . Cancer Maternal Grandfather     pancreas   History  Substance Use Topics  . Smoking status: Never Smoker   . Smokeless tobacco: Never Used  . Alcohol Use: No   OB History   Grav Para Term Preterm Abortions TAB SAB Ect Mult Living   7 5 3 2 2  0 2 0 0 5     Review of Systems  Constitutional: Negative for fever and chills.  Musculoskeletal: Positive for back pain.       Chronic back pain, unchanged  Neurological: Negative for weakness and numbness.    Allergies  Review of patient's allergies indicates no known allergies.  Home Medications   Current Outpatient Rx  Name  Route  Sig  Dispense  Refill  . ibuprofen (ADVIL,MOTRIN) 200 MG tablet   Oral   Take 800 mg by mouth every 6 (six) hours as needed for pain.         Marland Kitchen  oxyCODONE-acetaminophen (PERCOCET) 5-325 MG per tablet   Oral   Take 1-2 tablets by mouth every 6 (six) hours as needed for pain.   20 tablet   0   . promethazine (PHENERGAN) 25 MG tablet   Oral   Take 1 tablet (25 mg total) by mouth every 6 (six) hours as needed for nausea.   20 tablet   0    BP 162/112  Pulse 91  Temp(Src) 98.4 F (36.9 C) (Oral)  Resp 16  SpO2 99%  LMP 08/07/2012 Physical Exam  Nursing note and vitals reviewed. Constitutional: She appears well-developed and well-nourished. No distress.  HENT:  Head: Normocephalic and atraumatic.  Neck: Neck supple.  Pulmonary/Chest: Effort normal.  Musculoskeletal:       Left ankle: She exhibits decreased range of motion. She exhibits no swelling, no ecchymosis, no deformity and no laceration. Tenderness. Lateral malleolus tenderness found.       Left lower leg: Normal.       Left foot: Normal.  Neurological: She is alert.  Skin: She is not diaphoretic.    ED Course  Procedures (including critical care time) Labs Reviewed - No data to display Dg Ankle Complete Left  08/19/2012   *RADIOLOGY REPORT*  Clinical Data: Ankle pain  LEFT  ANKLE COMPLETE - 3+ VIEW  Comparison: None  Findings: Lateral soft tissue swelling identified.  There is a chronic, healed fracture deformity involving the distal fibula.  No acute fractures.  No radiopaque foreign bodies identified.  IMPRESSION:  1. Soft tissue swelling  2.  Chronic healed fracture of the distal fibula.   Original Report Authenticated By: Signa Kell, M.D.   1. Left ankle pain   2. Hypertension     MDM  Pt with hx left ankle fracture p/w 1 week of increased pain in same ankle.  No new injury.  Neurovascularly intact.  Fracture healed on xray.  Likely arthritis associated with old fracture.  No red flags.  Doubt septic joint.  D/C home with Ace wrap, ultram, PCP follow up. Discussed all results with patient.  Pt given return precautions.  Pt verbalizes understanding and  agrees with plan.     Pt notes she has chronic hypertension.  States she is supposed to be on medication but does not have a doctor to prescribe it.  I have discussed with her the risks of persistent hypertension.  Pt does not have headache, CP, SOB.  No AMS.  I have prescribed one month of HCTZ and have asked her to recheck it within one week.  Pt verbalizes understanding and agrees with plan.   Trixie Dredge, PA-C 08/19/12 1537  [place], [place] 08/19/12 1546

## 2012-08-20 NOTE — ED Provider Notes (Signed)
Medical screening examination/treatment/procedure(s) were performed by non-physician practitioner and as supervising physician I was immediately available for consultation/collaboration.   Laray Anger, DO 08/20/12 [phone]

## 2012-09-21 ENCOUNTER — Emergency Department (HOSPITAL_COMMUNITY)
Admission: EM | Admit: 2012-09-21 | Discharge: 2012-09-21 | Disposition: A | Discharge status: Home / Self Care | Payer: Self-pay | Attending: Emergency Medicine | Admitting: Emergency Medicine

## 2012-09-21 ENCOUNTER — Encounter (HOSPITAL_COMMUNITY): Payer: Self-pay | Admitting: Emergency Medicine

## 2012-09-21 ENCOUNTER — Emergency Department (HOSPITAL_COMMUNITY): Payer: Self-pay

## 2012-09-21 DIAGNOSIS — I10 Essential (primary) hypertension: Secondary | ICD-10-CM | POA: Insufficient documentation

## 2012-09-21 DIAGNOSIS — Z79899 Other long term (current) drug therapy: Secondary | ICD-10-CM | POA: Insufficient documentation

## 2012-09-21 DIAGNOSIS — J45901 Unspecified asthma with (acute) exacerbation: Secondary | ICD-10-CM | POA: Insufficient documentation

## 2012-09-21 DIAGNOSIS — R0602 Shortness of breath: Secondary | ICD-10-CM

## 2012-09-21 DIAGNOSIS — R079 Chest pain, unspecified: Secondary | ICD-10-CM | POA: Insufficient documentation

## 2012-09-21 DIAGNOSIS — Z8781 Personal history of (healed) traumatic fracture: Secondary | ICD-10-CM | POA: Insufficient documentation

## 2012-09-21 LAB — CBC
HCT: 40.3 % (ref 36.0–46.0)
Hemoglobin: 14.5 g/dL (ref 12.0–15.0)
RDW: 12.5 % (ref 11.5–15.5)
WBC: 9.7 10*3/uL (ref 4.0–10.5)

## 2012-09-21 LAB — BASIC METABOLIC PANEL
BUN: 15 mg/dL (ref 6–23)
Chloride: 104 mEq/L (ref 96–112)
GFR calc Af Amer: >90 mL/min (ref >90)
GFR calc non Af Amer: >90 mL/min (ref >90)
Potassium: 3.6 mEq/L (ref 3.5–5.1)
Sodium: 139 mEq/L (ref 135–145)

## 2012-09-21 LAB — POCT I-STAT TROPONIN I: Troponin i, poc: 0.01 ng/mL (ref 0.00–0.08)

## 2012-09-21 MED ORDER — GI COCKTAIL ~~LOC~~
30.0000 mL | Freq: Once | ORAL | Status: AC
  Administered 2012-09-21: 30 mL via ORAL
  Filled 2012-09-21: qty 30

## 2012-09-21 MED ORDER — ASPIRIN 325 MG PO TABS: 325.0000 mg | ORAL_TABLET | Freq: Every day | ORAL | Status: DC

## 2012-09-21 MED ORDER — PANTOPRAZOLE SODIUM 20 MG PO TBEC: 20.0000 mg | DELAYED_RELEASE_TABLET | Freq: Every day | ORAL | Status: DC

## 2012-09-21 NOTE — ED Provider Notes (Signed)
CSN: [id number]     Arrival date & time 09/21/12  1638 History     First MD Initiated Contact with Patient 09/21/12 1822     Chief Complaint  Patient presents with  . Shortness of Breath  . Chest Pain   (Consider location/radiation/quality/duration/timing/severity/associated sxs/prior Treatment) The history is provided by the patient and medical records.   Patient presents to the ED for shortness of breath and chest pain after swallowing lake water 2 days ago.  Pain is intermittent, midsternal, radiating to the throat after burping.  No associated palpitations, dizziness, weakness, or diaphoresis.  Patient states she has been burping constantly since swallowing the water and states that they taste like "lake water".  She is concerned that the water is now in her lungs.  Pt has a history of asthma and has an albuterol inhaler for PRN use but states she has not used it recently because she did not think it would help her symptoms.  No meds taken PTA.  No LE edema, calf pain, surgery, or travel.  No prior cardiac hx or PE.  No abdominal pain, nausea, vomiting, or diarrhea.  No cough, chest congestion, fevers, sweats, or chills.  Pt has [facility] PCP at [facility].  Past Medical History  Diagnosis Date  . Asthma   . Hypertension   . Ankle fracture, left    Past Surgical History  Procedure Laterality Date  . Tubal ligation     Family History  Problem Relation Age of Onset  . Hypertension Mother   . Cancer Maternal Grandfather     pancreas   History  Substance Use Topics  . Smoking status: Never Smoker   . Smokeless tobacco: Never Used  . Alcohol Use: No   OB History   Grav Para Term Preterm Abortions TAB SAB Ect Mult Living   7 5 3 2 2  0 2 0 0 5     Review of Systems  Respiratory: Positive for shortness of breath.   Cardiovascular: Positive for chest pain.  All other systems reviewed and are negative.    Allergies  Review of patient's allergies indicates no known  allergies.  Home Medications   Current Outpatient Rx  Name  Route  Sig  Dispense  Refill  . cholecalciferol (VITAMIN D-400) 400 UNITS TABS tablet   Oral   Take 800 Units by mouth 2 (two) times daily.         . hydrochlorothiazide (HYDRODIURIL) 25 MG tablet   Oral   Take 1 tablet (25 mg total) by mouth daily.   30 tablet   0   . ibuprofen (ADVIL,MOTRIN) 200 MG tablet   Oral   Take 800 mg by mouth every 6 (six) hours as needed for pain. For pain         . lisinopril (PRINIVIL,ZESTRIL) 20 MG tablet   Oral   Take 20 mg by mouth daily.          BP 157/112  Pulse 75  Temp(Src) 97.9 F (36.6 C) (Oral)  Resp 22  SpO2 100%  LMP 09/12/2012  Physical Exam  Nursing note and vitals reviewed. Constitutional: She is oriented to person, place, and time. She appears well-developed and well-nourished. No distress.  HENT:  Head: Normocephalic and atraumatic.  Mouth/Throat: Oropharynx is clear and moist.  Eyes: Conjunctivae and EOM are normal. Pupils are equal, round, and reactive to light.  Neck: Normal range of motion. Neck supple.  Cardiovascular: Normal rate, regular rhythm and normal heart sounds.  Pulmonary/Chest: Effort normal and breath sounds normal. No respiratory distress. She has no wheezes. She has no rales.  Abdominal: Soft. Bowel sounds are normal. There is no tenderness. There is no guarding.  Musculoskeletal: Normal range of motion. She exhibits no edema.  Neurological: She is alert and oriented to person, place, and time.  Skin: Skin is warm and dry. She is not diaphoretic.  Psychiatric: She has a normal mood and affect.    ED Course   Procedures (including critical care time)   Date: 09/21/2012  Rate: 78  Rhythm: normal sinus rhythm and premature atrial contractions (PAC)  QRS Axis: normal  Intervals: normal  ST/T Wave abnormalities: nonspecific T wave changes  Conduction Disutrbances:none  Narrative Interpretation:   Old EKG Reviewed:  unchanged  Labs Reviewed  BASIC METABOLIC PANEL - Abnormal; Notable for the following:    Calcium 10.8 (*)    All other components within normal limits  CBC  POCT I-STAT TROPONIN I   Dg Chest 2 View  09/21/2012   *RADIOLOGY REPORT*  Clinical Data: Shortness of breath and chest pain.  CHEST - 2 VIEW  Comparison: None  Findings: Lateral view degraded by patient arm position.  Minimal S-shaped thoracolumbar spine curvature. Midline trachea. Normal heart size and mediastinal contours. No pleural effusion or pneumothorax.  Clear lungs.  IMPRESSION: No acute cardiopulmonary disease.   Original Report Authenticated By: Jeronimo Greaves, M.D.   1. Chest pain   2. SOB (shortness of breath)     MDM   EKG NSR with PACs, no old for comparison.  Trop negative.  CXR clear.  Labs largely WNL.  Pt given GI cocktail which she states did not help.  Pt is having no difficulty breathing, in room talking with friends and family, NAD, lungs CTAB, VS remained stable.  Signs/sx more consistent with GERD.  I doubt ACS, PE, dissection or other vascular collapse.  I offered pt cardiology referral if she would further work-up, but she declined.  She does have a new PCP at [facility] that i have advised her to FU with and discuss this ED visit.  Will give trial of protonix.  Encouraged to start using albuterol inhaler to hep with breathing. Discussed plan with pt, she agreed.  Return precautions advised.  Discussed with Dr. Karma Ganja who agrees with assessment and plan.   Garlon Hatchet, PA-C 09/21/12 [phone]

## 2012-09-21 NOTE — ED Notes (Signed)
Pt states that she swallowed some lake water today and now she is having chest pain and some sob

## 2012-09-21 NOTE — ED Provider Notes (Signed)
Medical screening examination/treatment/procedure(s) were performed by non-physician practitioner and as supervising physician I was immediately available for consultation/collaboration.  Ethelda Chick, MD 09/21/12 2001

## 2013-01-06 ENCOUNTER — Emergency Department: Payer: Self-pay | Admitting: Internal Medicine

## 2013-01-06 LAB — URINALYSIS, COMPLETE
Bilirubin,UR: NEGATIVE
Glucose,UR: 50 mg/dL (ref 0–75)
Nitrite: NEGATIVE
WBC UR: 55 /HPF (ref 0–5)

## 2013-01-06 LAB — CBC WITH DIFFERENTIAL/PLATELET
Basophil #: 0.2 10*3/uL — ABNORMAL HIGH (ref 0.0–0.1)
Basophil %: 1.4 %
Eosinophil %: 3 %
HCT: 41.3 % (ref 35.0–47.0)
Lymphocyte %: 30.9 %
MCH: 31.4 pg (ref 26.0–34.0)
MCHC: 34.9 g/dL (ref 32.0–36.0)
Monocyte #: 0.6 x10 3/mm (ref 0.2–0.9)
RBC: 4.6 10*6/uL (ref 3.80–5.20)
RDW: 12.7 % (ref 11.5–14.5)

## 2013-01-10 ENCOUNTER — Emergency Department: Payer: Self-pay | Admitting: Emergency Medicine

## 2013-01-10 LAB — CBC
HCT: 39.9 % (ref 35.0–47.0)
HGB: 14.1 g/dL (ref 12.0–16.0)
MCH: 31.7 pg (ref 26.0–34.0)
MCHC: 35.3 g/dL (ref 32.0–36.0)
MCV: 90 fL (ref 80–100)
RBC: 4.45 10*6/uL (ref 3.80–5.20)
RDW: 12.7 % (ref 11.5–14.5)
WBC: 8.4 10*3/uL (ref 3.6–11.0)

## 2013-01-10 LAB — BASIC METABOLIC PANEL
Anion Gap: 5 — ABNORMAL LOW (ref 7–16)
BUN: 12 mg/dL (ref 7–18)
Calcium, Total: 9.4 mg/dL (ref 8.5–10.1)
EGFR (Non-African Amer.): >60
Glucose: 71 mg/dL (ref 65–99)
Potassium: 3.8 mmol/L (ref 3.5–5.1)
Sodium: 136 mmol/L (ref 136–145)

## 2013-01-10 LAB — TROPONIN I
Troponin-I: <0.02 ng/mL
Troponin-I: <0.02 ng/mL

## 2013-02-18 ENCOUNTER — Emergency Department: Payer: Self-pay | Admitting: Emergency Medicine

## 2013-02-18 LAB — COMPREHENSIVE METABOLIC PANEL
ALK PHOS: 80 U/L
ANION GAP: 4 — AB (ref 7–16)
Albumin: 3.8 g/dL (ref 3.4–5.0)
BUN: 13 mg/dL (ref 7–18)
Bilirubin,Total: 0.4 mg/dL (ref 0.2–1.0)
CALCIUM: 10.3 mg/dL — AB (ref 8.5–10.1)
CHLORIDE: 106 mmol/L (ref 98–107)
CO2: 29 mmol/L (ref 21–32)
CREATININE: 0.78 mg/dL (ref 0.60–1.30)
EGFR (African American): >60
EGFR (Non-African Amer.): >60
Glucose: 92 mg/dL (ref 65–99)
OSMOLALITY: 277 (ref 275–301)
Potassium: 3.5 mmol/L (ref 3.5–5.1)
SGOT(AST): 39 U/L — ABNORMAL HIGH (ref 15–37)
SGPT (ALT): 68 U/L (ref 12–78)
SODIUM: 139 mmol/L (ref 136–145)
Total Protein: 7.7 g/dL (ref 6.4–8.2)

## 2013-02-18 LAB — CBC WITH DIFFERENTIAL/PLATELET
BASOS PCT: 0.7 %
Basophil #: 0.1 10*3/uL (ref 0.0–0.1)
EOS ABS: 0.3 10*3/uL (ref 0.0–0.7)
Eosinophil %: 2.8 %
HCT: 40.6 % (ref 35.0–47.0)
HGB: 14.1 g/dL (ref 12.0–16.0)
LYMPHS ABS: 3.7 10*3/uL — AB (ref 1.0–3.6)
Lymphocyte %: 33 %
MCH: 31.6 pg (ref 26.0–34.0)
MCHC: 34.7 g/dL (ref 32.0–36.0)
MCV: 91 fL (ref 80–100)
MONO ABS: 0.6 x10 3/mm (ref 0.2–0.9)
Monocyte %: 5.5 %
NEUTROS ABS: 6.6 10*3/uL — AB (ref 1.4–6.5)
NEUTROS PCT: 58 %
Platelet: 243 10*3/uL (ref 150–440)
RBC: 4.46 10*6/uL (ref 3.80–5.20)
RDW: 12.9 % (ref 11.5–14.5)
WBC: 11.4 10*3/uL — AB (ref 3.6–11.0)

## 2013-02-18 LAB — URINALYSIS, COMPLETE
BLOOD: NEGATIVE
Bacteria: NONE SEEN
Bilirubin,UR: NEGATIVE
GLUCOSE, UR: NEGATIVE mg/dL (ref 0–75)
KETONE: NEGATIVE
Leukocyte Esterase: NEGATIVE
Nitrite: NEGATIVE
Ph: 7 (ref 4.5–8.0)
Protein: NEGATIVE
RBC,UR: <1 /HPF (ref 0–5)
Specific Gravity: 1.01 (ref 1.003–1.030)
WBC UR: 2 /HPF (ref 0–5)

## 2013-02-18 LAB — WET PREP, GENITAL

## 2013-02-19 LAB — GC/CHLAMYDIA PROBE AMP

## 2013-09-27 ENCOUNTER — Encounter: Payer: Self-pay | Admitting: Physician Assistant

## 2013-12-16 ENCOUNTER — Encounter (HOSPITAL_COMMUNITY): Payer: Self-pay | Admitting: Emergency Medicine

## 2013-12-24 ENCOUNTER — Emergency Department (HOSPITAL_COMMUNITY): Payer: Medicaid Other

## 2013-12-24 ENCOUNTER — Encounter (HOSPITAL_COMMUNITY): Payer: Self-pay | Admitting: Adult Health

## 2013-12-24 ENCOUNTER — Emergency Department (HOSPITAL_COMMUNITY)
Admission: EM | Admit: 2013-12-24 | Discharge: 2013-12-25 | Disposition: A | Discharge status: Home / Self Care | Payer: Medicaid Other | Attending: Emergency Medicine | Admitting: Emergency Medicine

## 2013-12-24 DIAGNOSIS — Z79899 Other long term (current) drug therapy: Secondary | ICD-10-CM | POA: Diagnosis not present

## 2013-12-24 DIAGNOSIS — R079 Chest pain, unspecified: Secondary | ICD-10-CM | POA: Diagnosis present

## 2013-12-24 DIAGNOSIS — J45909 Unspecified asthma, uncomplicated: Secondary | ICD-10-CM | POA: Insufficient documentation

## 2013-12-24 DIAGNOSIS — Z8781 Personal history of (healed) traumatic fracture: Secondary | ICD-10-CM | POA: Diagnosis not present

## 2013-12-24 DIAGNOSIS — I10 Essential (primary) hypertension: Secondary | ICD-10-CM | POA: Insufficient documentation

## 2013-12-24 LAB — BASIC METABOLIC PANEL
Anion gap: 11 (ref 5–15)
BUN: 13 mg/dL (ref 6–23)
CALCIUM: 11 mg/dL — AB (ref 8.4–10.5)
CO2: 27 mEq/L (ref 19–32)
CREATININE: 0.92 mg/dL (ref 0.50–1.10)
Chloride: 103 mEq/L (ref 96–112)
GFR calc Af Amer: >90 mL/min (ref >90)
GFR, EST NON AFRICAN AMERICAN: 82 mL/min — AB (ref >90)
GLUCOSE: 109 mg/dL — AB (ref 70–99)
Potassium: 4.4 mEq/L (ref 3.7–5.3)
Sodium: 141 mEq/L (ref 137–147)

## 2013-12-24 LAB — CBC
HEMATOCRIT: 42 % (ref 36.0–46.0)
HEMOGLOBIN: 14.6 g/dL (ref 12.0–15.0)
MCH: 30.9 pg (ref 26.0–34.0)
MCHC: 34.8 g/dL (ref 30.0–36.0)
MCV: 88.8 fL (ref 78.0–100.0)
Platelets: 256 10*3/uL (ref 150–400)
RBC: 4.73 MIL/uL (ref 3.87–5.11)
RDW: 12.2 % (ref 11.5–15.5)
WBC: 10.8 10*3/uL — ABNORMAL HIGH (ref 4.0–10.5)

## 2013-12-24 LAB — I-STAT TROPONIN, ED
Troponin i, poc: 0.02 ng/mL (ref 0.00–0.08)
Troponin i, poc: 0.03 ng/mL (ref 0.00–0.08)

## 2013-12-24 MED ORDER — KETOROLAC TROMETHAMINE 30 MG/ML IJ SOLN
30.0000 mg | Freq: Once | INTRAMUSCULAR | Status: AC
  Administered 2013-12-24: 30 mg via INTRAVENOUS
  Filled 2013-12-24: qty 1

## 2013-12-24 NOTE — ED Notes (Signed)
Presents with sternal chest pain began while sitting in car line to pick up children at 3 pm today, assocaited with SOB and dizziness. Pain is described as constant and sharp. Pt is HTN with 177/122, has been out of BP medication for 3 weeks. Nothing makes pain better, nothing makes pain worse.

## 2013-12-24 NOTE — ED Notes (Signed)
Pt reports chest pain in the past that has been associated with HTN. Pt reports that she has been out of her BP meds for approx 2 weeks.

## 2013-12-24 NOTE — ED Provider Notes (Signed)
CSN: [id number]     Arrival date & time 12/24/13  1849 History   First MD Initiated Contact with Patient 12/24/13 2259     Chief Complaint  Patient presents with  . Chest Pain     (Consider location/radiation/quality/duration/timing/severity/associated sxs/prior Treatment) HPI Comments: Patient is a 33 year old female with history of hypertension. She presents with complaints of sharp pain in the front of her chest that started at approximately 2 PM. She states she was in her car picking up her daughter at school when the symptoms began. She feels occasionally short of breath but denies nausea, diaphoresis, or radiation to the arm or jaw. She denies any fever or cough. She denies any exertional symptoms. She states that she had a stress test performed several years ago which was unremarkable at an outside facility. She also tells me that she ran out of her blood pressure medications approximately 2 weeks ago.  Patient is a 33 y.o. female presenting with chest pain. The history is provided by the patient.  Chest Pain Chest pain location: anterior chest wall and sternum. Pain quality: sharp   Pain radiates to:  Does not radiate Pain radiates to the back: no   Pain severity:  Moderate Onset quality:  Sudden Duration:  9 hours Timing:  Constant Progression:  Unchanged Chronicity:  New Context: breathing and movement   Relieved by:  Nothing Worsened by:  Deep breathing and movement (position and palpation) Ineffective treatments:  None tried Associated symptoms: no cough, no diaphoresis, no fever, no palpitations and no shortness of breath     Past Medical History  Diagnosis Date  . Asthma   . Hypertension   . Ankle fracture, left    Past Surgical History  Procedure Laterality Date  . Tubal ligation     Family History  Problem Relation Age of Onset  . Hypertension Mother   . Cancer Maternal Grandfather     pancreas   History  Substance Use Topics  . Smoking status: Never  Smoker   . Smokeless tobacco: Never Used  . Alcohol Use: No   OB History    Gravida Para Term Preterm AB TAB SAB Ectopic Multiple Living   7 5 3 2 2  0 2 0 0 5     Review of Systems  Constitutional: Negative for fever and diaphoresis.  Respiratory: Negative for cough and shortness of breath.   Cardiovascular: Positive for chest pain. Negative for palpitations.  All other systems reviewed and are negative.     Allergies  Review of patient's allergies indicates no known allergies.  Home Medications   Prior to Admission medications   Medication Sig Start Date End Date Taking? Authorizing Provider  amLODipine (NORVASC) 5 MG tablet Take 5 mg by mouth daily.   Yes Historical Provider, MD  cholecalciferol (VITAMIN D-400) 400 UNITS TABS tablet Take 800 Units by mouth 2 (two) times daily.   Yes Historical Provider, MD  ibuprofen (ADVIL,MOTRIN) 200 MG tablet Take 800 mg by mouth every 6 (six) hours as needed for pain. For pain   Yes Historical Provider, MD  lisinopril-hydrochlorothiazide (PRINZIDE,ZESTORETIC) 20-12.5 MG per tablet Take 1 tablet by mouth daily.   Yes Historical Provider, MD  pantoprazole (PROTONIX) 20 MG tablet Take 1 tablet (20 mg total) by mouth daily. 09/21/12  Yes Garlon HatchetLisa M Sanders, PA-C  hydrochlorothiazide (HYDRODIURIL) 25 MG tablet Take 1 tablet (25 mg total) by mouth daily. Patient not taking: Reported on 12/24/2013 08/19/12   Trixie DredgeEmily West, PA-C   BP  168/118 mmHg  Pulse 89  Temp(Src) 97.5 F (36.4 C) (Oral)  Resp 19  Ht 5\' 4"  (1.626 m)  Wt 236 lb (107.049 kg)  BMI 40.49 kg/m2  SpO2 98%  LMP 12/11/2013 (Approximate) Physical Exam  Constitutional: She is oriented to person, place, and time. She appears well-developed and well-nourished. No distress.  HENT:  Head: Normocephalic and atraumatic.  Neck: Normal range of motion. Neck supple.  Cardiovascular: Normal rate and regular rhythm.  Exam reveals no gallop and no friction rub.   No murmur heard. Pulmonary/Chest:  Effort normal and breath sounds normal. No respiratory distress. She has no wheezes.  Abdominal: Soft. Bowel sounds are normal. She exhibits no distension. There is no tenderness.  Musculoskeletal: Normal range of motion. She exhibits no edema.  There is no edema or calf tenderness. Homans sign is absent bilaterally.  Neurological: She is alert and oriented to person, place, and time.  Skin: Skin is warm and dry. She is not diaphoretic.  Nursing note and vitals reviewed.   ED Course  Procedures (including critical care time) Labs Review Labs Reviewed  CBC - Abnormal; Notable for the following:    WBC 10.8 (*)    All other components within normal limits  BASIC METABOLIC PANEL - Abnormal; Notable for the following:    Glucose, Bld 109 (*)    Calcium 11.0 (*)    GFR calc non Af Amer 82 (*)    All other components within normal limits  I-STAT TROPOININ, ED  Rosezena SensorI-STAT TROPOININ, ED    Imaging Review Dg Chest 2 View  12/24/2013   CLINICAL DATA:  Acute onset of mid chest pain. History of valvular disease. Some shortness of breath.  EXAM: CHEST  2 VIEW  COMPARISON:  09/21/2012  FINDINGS: Heart size is normal. Mediastinal shadows are normal. The lungs are clear. No bronchial thickening. No infiltrate, mass, effusion or collapse. Pulmonary vascularity is normal. No bony abnormality.  IMPRESSION: Normal chest   Electronically Signed   By: Paulina FusiMark  Shogry M.D.   On: 12/24/2013 19:57     EKG Interpretation   Date/Time:  Tuesday December 24 2013 18:57:29 EST Ventricular Rate:  102 PR Interval:  176 QRS Duration: 84 QT Interval:  354 QTC Calculation: 461 R Axis:   -7 Text Interpretation:  Sinus tachycardia Left ventricular hypertrophy  Cannot rule out Septal infarct , age undetermined Abnormal ECG No  significant change since 09/21/12 Confirmed by DELOS  MD, Latrica Clowers ([id number]) on  12/24/2013 11:09:44 PM      MDM   Final diagnoses:  Chest pain    Patient is a 33 year old female with no  prior cardiac history and history of hypertension. She presents today with complaints of sharp pains in the front of her chest that started earlier today while driving. There is no associated shortness of breath, nausea, diaphoresis. There is no exertional component. Workup today reveals an unchanged EKG and negative troponin 2. Her pain is reproducible with palpation of the chest wall and I highly doubt a cardiac etiology. She is feeling better with Toradol and I feel as though she is appropriate for discharge. She has had a stress test in the past, however for pain continues I have advised her to follow-up with cardiology to discuss having this repeated.    Geoffery Lyonsouglas Collan Schoenfeld, MD 12/25/13 [phone]

## 2013-12-24 NOTE — ED Notes (Signed)
Dr.Lockwood at bedside  

## 2013-12-25 MED ORDER — HYDROCODONE-ACETAMINOPHEN 5-325 MG PO TABS: 1.0000 | ORAL_TABLET | Freq: Four times a day (QID) | ORAL | Status: DC | PRN

## 2013-12-25 MED ORDER — AMLODIPINE BESYLATE 5 MG PO TABS: 5.0000 mg | ORAL_TABLET | Freq: Every day | ORAL | Status: DC

## 2013-12-25 MED ORDER — LISINOPRIL-HYDROCHLOROTHIAZIDE 20-12.5 MG PO TABS: 1.0000 | ORAL_TABLET | Freq: Every day | ORAL | Status: DC

## 2013-12-25 NOTE — Discharge Instructions (Signed)
Ibuprofen 600 mg every 6 hours as needed for pain.  Hydrocodone as prescribed as needed for pain not relieved with ibuprofen.  Follow-up with cardiology if not improving in the next several days and return to the ER if your symptoms substantially worsen or change.   Chest Pain (Nonspecific) It is often hard to give a specific diagnosis for the cause of chest pain. There is always a chance that your pain could be related to something serious, such as a heart attack or a blood clot in the lungs. You need to follow up with your health care provider for further evaluation. CAUSES   Heartburn.  Pneumonia or bronchitis.  Anxiety or stress.  Inflammation around your heart (pericarditis) or lung (pleuritis or pleurisy).  A blood clot in the lung.  A collapsed lung (pneumothorax). It can develop suddenly on its own (spontaneous pneumothorax) or from trauma to the chest.  Shingles infection (herpes zoster virus). The chest wall is composed of bones, muscles, and cartilage. Any of these can be the source of the pain.  The bones can be bruised by injury.  The muscles or cartilage can be strained by coughing or overwork.  The cartilage can be affected by inflammation and become sore (costochondritis). DIAGNOSIS  Lab tests or other studies may be needed to find the cause of your pain. Your health care provider may have you take a test called an ambulatory electrocardiogram (ECG). An ECG records your heartbeat patterns over a 24-hour period. You may also have other tests, such as:  Transthoracic echocardiogram (TTE). During echocardiography, sound waves are used to evaluate how blood flows through your heart.  Transesophageal echocardiogram (TEE).  Cardiac monitoring. This allows your health care provider to monitor your heart rate and rhythm in real time.  Holter monitor. This is a portable device that records your heartbeat and can help diagnose heart arrhythmias. It allows your health  care provider to track your heart activity for several days, if needed.  Stress tests by exercise or by giving medicine that makes the heart beat faster. TREATMENT   Treatment depends on what may be causing your chest pain. Treatment may include:  Acid blockers for heartburn.  Anti-inflammatory medicine.  Pain medicine for inflammatory conditions.  Antibiotics if an infection is present.  You may be advised to change lifestyle habits. This includes stopping smoking and avoiding alcohol, caffeine, and chocolate.  You may be advised to keep your head raised (elevated) when sleeping. This reduces the chance of acid going backward from your stomach into your esophagus. Most of the time, nonspecific chest pain will improve within 2-3 days with rest and mild pain medicine.  HOME CARE INSTRUCTIONS   If antibiotics were prescribed, take them as directed. Finish them even if you start to feel better.  For the next few days, avoid physical activities that bring on chest pain. Continue physical activities as directed.  Do not use any tobacco products, including cigarettes, chewing tobacco, or electronic cigarettes.  Avoid drinking alcohol.  Only take medicine as directed by your health care provider.  Follow your health care provider's suggestions for further testing if your chest pain does not go away.  Keep any follow-up appointments you made. If you do not go to an appointment, you could develop lasting (chronic) problems with pain. If there is any problem keeping an appointment, call to reschedule. SEEK MEDICAL CARE IF:   Your chest pain does not go away, even after treatment.  You have a rash with  blisters on your chest.  You have a fever. SEEK IMMEDIATE MEDICAL CARE IF:   You have increased chest pain or pain that spreads to your arm, neck, jaw, back, or abdomen.  You have shortness of breath.  You have an increasing cough, or you cough up blood.  You have severe back or  abdominal pain.  You feel nauseous or vomit.  You have severe weakness.  You faint.  You have chills. This is an emergency. Do not wait to see if the pain will go away. Get medical help at once. Call your local emergency services (911 in [place] Do not drive yourself to the hospital. MAKE SURE YOU:   Understand these instructions.  Will watch your condition.  Will get help right away if you are not doing well or get worse. Document Released: 11/10/2004 Document Revised: 02/05/2013 Document Reviewed: 09/06/2007 [facility] Patient Information 2015 [employer] [place]. This information is not intended to replace advice given to you by your health care provider. Make sure you discuss any questions you have with your health care provider.

## 2014-06-05 ENCOUNTER — Emergency Department
Admit: 2014-06-05 | Disposition: A | Discharge status: Home / Self Care | Payer: Self-pay | Admitting: Emergency Medicine

## 2014-06-05 LAB — BASIC METABOLIC PANEL
Anion Gap: 6 — ABNORMAL LOW (ref 7–16)
BUN: 18 mg/dL
CO2: 22 mmol/L
CREATININE: 0.84 mg/dL
Calcium, Total: 10.3 mg/dL
Chloride: 111 mmol/L
EGFR (African American): >60
GLUCOSE: 103 mg/dL — AB
POTASSIUM: 3.7 mmol/L
Sodium: 139 mmol/L

## 2014-06-05 LAB — CBC
HCT: 41.4 % (ref 35.0–47.0)
HGB: 13.9 g/dL (ref 12.0–16.0)
MCH: 31.2 pg (ref 26.0–34.0)
MCHC: 33.6 g/dL (ref 32.0–36.0)
MCV: 93 fL (ref 80–100)
Platelet: 201 10*3/uL (ref 150–440)
RBC: 4.45 10*6/uL (ref 3.80–5.20)
RDW: 13.2 % (ref 11.5–14.5)
WBC: 6.5 10*3/uL (ref 3.6–11.0)

## 2014-06-05 LAB — TROPONIN I: Troponin-I: <0.03 ng/mL

## 2014-09-20 ENCOUNTER — Emergency Department
Admission: EM | Admit: 2014-09-20 | Discharge: 2014-09-21 | Disposition: A | Discharge status: Home / Self Care | Payer: Medicaid Other | Attending: Student | Admitting: Student

## 2014-09-20 ENCOUNTER — Emergency Department: Payer: Medicaid Other

## 2014-09-20 ENCOUNTER — Encounter: Payer: Self-pay | Admitting: Emergency Medicine

## 2014-09-20 DIAGNOSIS — I10 Essential (primary) hypertension: Secondary | ICD-10-CM | POA: Diagnosis not present

## 2014-09-20 DIAGNOSIS — Z79899 Other long term (current) drug therapy: Secondary | ICD-10-CM | POA: Diagnosis not present

## 2014-09-20 DIAGNOSIS — Z3202 Encounter for pregnancy test, result negative: Secondary | ICD-10-CM | POA: Diagnosis not present

## 2014-09-20 DIAGNOSIS — N8320 Unspecified ovarian cysts: Secondary | ICD-10-CM | POA: Insufficient documentation

## 2014-09-20 DIAGNOSIS — R52 Pain, unspecified: Secondary | ICD-10-CM

## 2014-09-20 DIAGNOSIS — R1031 Right lower quadrant pain: Secondary | ICD-10-CM

## 2014-09-20 DIAGNOSIS — N83201 Unspecified ovarian cyst, right side: Secondary | ICD-10-CM

## 2014-09-20 HISTORY — DX: Migraine, unspecified, not intractable, without status migrainosus: G43.909

## 2014-09-20 LAB — URINALYSIS COMPLETE WITH MICROSCOPIC (ARMC ONLY)
BACTERIA UA: NONE SEEN
BILIRUBIN URINE: NEGATIVE
GLUCOSE, UA: 150 mg/dL — AB
Hgb urine dipstick: NEGATIVE
Ketones, ur: NEGATIVE mg/dL
NITRITE: NEGATIVE
Protein, ur: NEGATIVE mg/dL
RBC / HPF: NONE SEEN RBC/hpf (ref 0–5)
Specific Gravity, Urine: 1.02 (ref 1.005–1.030)
pH: 5 (ref 5.0–8.0)

## 2014-09-20 LAB — BASIC METABOLIC PANEL
ANION GAP: 9 (ref 5–15)
BUN: 16 mg/dL (ref 6–20)
CALCIUM: 10.7 mg/dL — AB (ref 8.9–10.3)
CO2: 22 mmol/L (ref 22–32)
Chloride: 108 mmol/L (ref 101–111)
Creatinine, Ser: 0.72 mg/dL (ref 0.44–1.00)
GFR calc Af Amer: >60 mL/min (ref >60)
GFR calc non Af Amer: >60 mL/min (ref >60)
Glucose, Bld: 140 mg/dL — ABNORMAL HIGH (ref 65–99)
POTASSIUM: 3.3 mmol/L — AB (ref 3.5–5.1)
SODIUM: 139 mmol/L (ref 135–145)

## 2014-09-20 LAB — CBC WITH DIFFERENTIAL/PLATELET
Basophils Absolute: 0.1 10*3/uL (ref 0–0.1)
Basophils Relative: 1 %
EOS ABS: 0.5 10*3/uL (ref 0–0.7)
Eosinophils Relative: 5 %
HEMATOCRIT: 41.6 % (ref 35.0–47.0)
HEMOGLOBIN: 14.4 g/dL (ref 12.0–16.0)
LYMPHS PCT: 36 %
Lymphs Abs: 3.5 10*3/uL (ref 1.0–3.6)
MCH: 31.1 pg (ref 26.0–34.0)
MCHC: 34.7 g/dL (ref 32.0–36.0)
MCV: 89.7 fL (ref 80.0–100.0)
Monocytes Absolute: 0.6 10*3/uL (ref 0.2–0.9)
Monocytes Relative: 6 %
NEUTROS ABS: 5.2 10*3/uL (ref 1.4–6.5)
Neutrophils Relative %: 52 %
Platelets: 229 10*3/uL (ref 150–440)
RBC: 4.64 MIL/uL (ref 3.80–5.20)
RDW: 12.8 % (ref 11.5–14.5)
WBC: 9.9 10*3/uL (ref 3.6–11.0)

## 2014-09-20 LAB — HCG, QUANTITATIVE, PREGNANCY: HCG, BETA CHAIN, QUANT, S: 1 m[IU]/mL (ref <5)

## 2014-09-20 LAB — LIPASE, BLOOD: LIPASE: 20 U/L — AB (ref 22–51)

## 2014-09-20 MED ORDER — OXYCODONE HCL 5 MG PO TABS: 5.0000 mg | ORAL_TABLET | Freq: Four times a day (QID) | ORAL | Status: DC | PRN

## 2014-09-20 MED ORDER — SODIUM CHLORIDE 0.9 % IV BOLUS (SEPSIS)
500.0000 mL | Freq: Once | INTRAVENOUS | Status: AC
  Administered 2014-09-20: 500 mL via INTRAVENOUS

## 2014-09-20 MED ORDER — MORPHINE SULFATE 4 MG/ML IJ SOLN
4.0000 mg | Freq: Once | INTRAMUSCULAR | Status: AC
  Administered 2014-09-20: 4 mg via INTRAVENOUS
  Filled 2014-09-20: qty 1

## 2014-09-20 MED ORDER — OXYCODONE-ACETAMINOPHEN 5-325 MG PO TABS
2.0000 | ORAL_TABLET | Freq: Once | ORAL | Status: AC
  Administered 2014-09-20: 2 via ORAL
  Filled 2014-09-20: qty 2

## 2014-09-20 MED ORDER — ONDANSETRON HCL 4 MG/2ML IJ SOLN
4.0000 mg | Freq: Once | INTRAMUSCULAR | Status: AC
  Administered 2014-09-20: 4 mg via INTRAVENOUS
  Filled 2014-09-20: qty 2

## 2014-09-20 NOTE — ED Notes (Signed)
Pt. States lower rt. Abdominal pain for the past 24 hours.  Pt. Denies vomiting and diarrhea.  Pt. States pain started after having intercourse last night.  Pt. States hx of ovian cyst.

## 2014-09-20 NOTE — ED Provider Notes (Signed)
[facility] Emergency Department Provider Note  ____________________________________________  Time seen: Approximately 10:00 PM  I have reviewed the triage vital signs and the nursing notes.   HISTORY  Chief Complaint Abdominal Pain    HPI Andrea Harmon is a 34 y.o. female with history of asthma, hypertension, ovarian cyst presents for evaluation of gradual onset right lower quadrant pain that began yesterday after intercourse. Pain has been constant since onset. Current severity pain is moderate. No modifying factors. No nausea, vomiting, diarrhea, fevers or chills. No chest pain or difficulty breathing. Denies abnormal vaginal bleeding or vaginal discharge. She denies any concerns for sexual transmitted infection stating that she is in a mutually monogamous sexual relationship with her husband.  Past Medical History  Diagnosis Date  . Asthma   . Hypertension   . Ankle fracture, left   . Migraine     Patient Active Problem List   Diagnosis Date Noted  . Pelvic pain in female 04/25/2012  . Other and unspecified ovarian cyst 04/25/2012    Past Surgical History  Procedure Laterality Date  . Tubal ligation    . Dnc      Current Outpatient Rx  Name  Route  Sig  Dispense  Refill  . amLODipine (NORVASC) 5 MG tablet   Oral   Take 1 tablet (5 mg total) by mouth daily.   30 tablet   0   . cholecalciferol (VITAMIN D-400) 400 UNITS TABS tablet   Oral   Take 800 Units by mouth 2 (two) times daily.         Marland Kitchen lisinopril-hydrochlorothiazide (PRINZIDE,ZESTORETIC) 20-12.5 MG per tablet   Oral   Take 1 tablet by mouth daily.   30 tablet   0   . pantoprazole (PROTONIX) 20 MG tablet   Oral   Take 1 tablet (20 mg total) by mouth daily.   30 tablet   0   . hydrochlorothiazide (HYDRODIURIL) 25 MG tablet   Oral   Take 1 tablet (25 mg total) by mouth daily. Patient not taking: Reported on 12/24/2013   30 tablet   0   . HYDROcodone-acetaminophen  (NORCO) 5-325 MG per tablet   Oral   Take 1-2 tablets by mouth every 6 (six) hours as needed.   10 tablet   0   . ibuprofen (ADVIL,MOTRIN) 200 MG tablet   Oral   Take 800 mg by mouth every 6 (six) hours as needed for pain. For pain           Allergies Review of patient's allergies indicates no known allergies.  Family History  Problem Relation Age of Onset  . Hypertension Mother   . Cancer Maternal Grandfather     pancreas    Social History History  Substance Use Topics  . Smoking status: Never Smoker   . Smokeless tobacco: Never Used  . Alcohol Use: No    Review of Systems Constitutional: No fever/chills Eyes: No visual changes. ENT: No sore throat. Cardiovascular: Denies chest pain. Respiratory: Denies shortness of breath. Gastrointestinal: + abdominal pain.  No nausea, no vomiting.  No diarrhea.  No constipation. Genitourinary: Negative for dysuria. Musculoskeletal: Negative for back pain. Skin: Negative for rash. Neurological: Negative for headaches, focal weakness or numbness.  10-point ROS otherwise negative.  ____________________________________________   PHYSICAL EXAM:  VITAL SIGNS: ED Triage Vitals  Enc Vitals Group     BP 09/20/14 1925 185/122 mmHg     Pulse Rate 09/20/14 1925 91  Resp 09/20/14 1925 16     Temp 09/20/14 1925 98.7 F (37.1 C)     Temp Source 09/20/14 1925 Oral     SpO2 09/20/14 1925 98 %     Weight 09/20/14 1925 220 lb (99.791 kg)     Height 09/20/14 1925 5\' 4"  (1.626 m)     Head Cir --      Peak Flow --      Pain Score 09/20/14 1926 8     Pain Loc --      Pain Edu? --      Excl. in GC? --     Constitutional: Alert and oriented. Nontoxic-appearing and in no acute distress. Eyes: Conjunctivae are normal. PERRL. EOMI. Head: Atraumatic. Nose: No congestion/rhinnorhea. Mouth/Throat: Mucous membranes are moist.  Oropharynx non-erythematous. Neck: No stridor.   Cardiovascular: Normal rate, regular rhythm. Grossly  normal heart sounds.  Good peripheral circulation. Respiratory: Normal respiratory effort.  No retractions. Lungs CTAB. Gastrointestinal: Soft with faint tenderness in the RLQ. No distention. No abdominal bruits. No CVA tenderness. Genitourinary: deferred Musculoskeletal: No lower extremity tenderness nor edema.  No joint effusions. Neurologic:  Normal speech and language. No gross focal neurologic deficits are appreciated. No gait instability. Skin:  Skin is warm, dry and intact. No rash noted. Psychiatric: Mood and affect are normal. Speech and behavior are normal.  ____________________________________________   LABS (all labs ordered are listed, but only abnormal results are displayed)  Labs Reviewed  BASIC METABOLIC PANEL - Abnormal; Notable for the following:    Potassium 3.3 (*)    Glucose, Bld 140 (*)    Calcium 10.7 (*)    All other components within normal limits  LIPASE, BLOOD - Abnormal; Notable for the following:    Lipase 20 (*)    All other components within normal limits  URINALYSIS COMPLETEWITH MICROSCOPIC ([facility] ONLY) - Abnormal; Notable for the following:    Color, Urine YELLOW (*)    APPearance CLEAR (*)    Glucose, UA 150 (*)    Leukocytes, UA 1+ (*)    Squamous Epithelial / LPF 0-5 (*)    All other components within normal limits  CBC WITH DIFFERENTIAL/PLATELET  HCG, QUANTITATIVE, PREGNANCY   ____________________________________________  EKG  none ____________________________________________  RADIOLOGY  Pelvic ultrasound IMPRESSION: 1. Somewhat complex right ovarian cyst, measuring 3.3 cm, may reflect a hemorrhagic cyst. 2. No evidence for ovarian torsion. 3. Small amount of free fluid within the pelvic cul-de-sac, with trace associated debris.  ____________________________________________   PROCEDURES  Procedure(s) performed: None  Critical Care performed: No  ____________________________________________   INITIAL IMPRESSION /  ASSESSMENT AND PLAN / ED COURSE  Pertinent labs & imaging results that were available during my care of the patient were reviewed by me and considered in my medical decision making (see chart for details).  Andrea Harmon is a 34 y.o. female with history of asthma, hypertension, ovarian cyst presents for evaluation of gradual onset right lower quadrant pain that began yesterday after intercourse. On exam, she is nontoxic appearing and in no acute distress. Hypertensive on arrival was likely secondary to pain. She has faint tenderness to palpation in the right lower quadrant. No leukocytosis, stable hemoglobin. Labs notable for mild hypercalcemia, mild hypocalcemia. Urinalysis without evidence of infection. She is not pregnant. Plan for ultrasound of the pelvis/transvaginal ultrasound to rule out traumatic torsion, evaluate for mass or cyst. Will treat her pain.  ----------------------------------------- 11:45 PM on 09/20/2014 -----------------------------------------  Pain Improved. Ultrasound shows complex right  ovarian cyst, no torsion. Discussed return precautions, need for close GYN follow-up. She is comfortable with the discharge plan. ____________________________________________   FINAL CLINICAL IMPRESSION(S) / ED DIAGNOSES  Final diagnoses:  Pain  RLQ abdominal pain      Gayla Doss, MD 09/21/14 2344

## 2014-09-25 ENCOUNTER — Encounter: Payer: Self-pay | Admitting: *Deleted

## 2014-09-25 ENCOUNTER — Other Ambulatory Visit: Payer: Medicaid Other

## 2014-09-25 NOTE — Patient Instructions (Signed)
  Your procedure is scheduled on: 09-30-14 Report to [facility] SAME [facility] 2ND FLOOR To find out your arrival time please call [phone] between 1PM - 3PM on 09-29-14  Remember: Instructions that are not followed completely may result in serious medical risk, up to and including death, or upon the discretion of your surgeon and anesthesiologist your surgery may need to be rescheduled.    _X___ 1. Do not eat food or drink liquids after midnight. No gum chewing or hard candies.     _X___ 2. No Alcohol for 24 hours before or after surgery.   ____ 3. Bring all medications with you on the day of surgery if instructed.    ____ 4. Notify your doctor if there is any change in your medical condition     (cold, fever, infections).     Do not wear jewelry, make-up, hairpins, clips or nail polish.  Do not wear lotions, powders, or perfumes. You may wear deodorant.  Do not shave 48 hours prior to surgery. Men may shave face and neck.  Do not bring valuables to the hospital.    [facility] is not responsible for any belongings or valuables.               Contacts, dentures or bridgework may not be worn into surgery.  Leave your suitcase in the car. After surgery it may be brought to your room.  For patients admitted to the hospital, discharge time is determined by your treatment team.   Patients discharged the day of surgery will not be allowed to drive home.   Please read over the following fact sheets that you were given:     _X___ Take these medicines the morning of surgery with A SIP OF WATER:    1. AMLODIPINE  2. PROTONIX  3. TAKE AN EXTRA PROTONIX ON Monday NIGHT  4.  5.  6.  ____ Fleet Enema (as directed)   ____ Use CHG Soap as directed  ____ Use inhalers on the day of surgery  ____ Stop metformin 2 days prior to surgery    ____ Take 1/2 of usual insulin dose the night before surgery and none on the morning of surgery.   ____ Stop  Coumadin/Plavix/aspirin-N/A  __X__ Stop Anti-inflammatories-STOP IBUPROFEN NOW-NO NSAIDS OR ASA PRODUCTS-TYLENOL OK   ____ Stop supplements until after surgery.    ____ Bring C-Pap to the hospital.

## 2014-09-30 ENCOUNTER — Encounter: Payer: Self-pay | Admitting: *Deleted

## 2014-09-30 ENCOUNTER — Ambulatory Visit: Payer: Medicaid Other | Admitting: Certified Registered Nurse Anesthetist

## 2014-09-30 ENCOUNTER — Ambulatory Visit
Admission: RE | Admit: 2014-09-30 | Discharge: 2014-09-30 | Disposition: A | Discharge status: Home / Self Care | Payer: Medicaid Other | Source: Ambulatory Visit | Attending: Obstetrics & Gynecology | Admitting: Obstetrics & Gynecology

## 2014-09-30 ENCOUNTER — Encounter
Admission: RE | Disposition: A | Discharge status: Home / Self Care | Payer: Self-pay | Source: Ambulatory Visit | Attending: Obstetrics & Gynecology

## 2014-09-30 DIAGNOSIS — K219 Gastro-esophageal reflux disease without esophagitis: Secondary | ICD-10-CM | POA: Insufficient documentation

## 2014-09-30 DIAGNOSIS — I252 Old myocardial infarction: Secondary | ICD-10-CM | POA: Diagnosis not present

## 2014-09-30 DIAGNOSIS — Z951 Presence of aortocoronary bypass graft: Secondary | ICD-10-CM | POA: Insufficient documentation

## 2014-09-30 DIAGNOSIS — J45909 Unspecified asthma, uncomplicated: Secondary | ICD-10-CM | POA: Insufficient documentation

## 2014-09-30 DIAGNOSIS — F329 Major depressive disorder, single episode, unspecified: Secondary | ICD-10-CM | POA: Insufficient documentation

## 2014-09-30 DIAGNOSIS — G43909 Migraine, unspecified, not intractable, without status migrainosus: Secondary | ICD-10-CM | POA: Insufficient documentation

## 2014-09-30 DIAGNOSIS — Z79899 Other long term (current) drug therapy: Secondary | ICD-10-CM | POA: Insufficient documentation

## 2014-09-30 DIAGNOSIS — N946 Dysmenorrhea, unspecified: Secondary | ICD-10-CM | POA: Diagnosis not present

## 2014-09-30 DIAGNOSIS — I25119 Atherosclerotic heart disease of native coronary artery with unspecified angina pectoris: Secondary | ICD-10-CM | POA: Insufficient documentation

## 2014-09-30 DIAGNOSIS — N8320 Unspecified ovarian cysts: Secondary | ICD-10-CM | POA: Diagnosis present

## 2014-09-30 DIAGNOSIS — I10 Essential (primary) hypertension: Secondary | ICD-10-CM | POA: Diagnosis not present

## 2014-09-30 DIAGNOSIS — N830 Follicular cyst of ovary: Secondary | ICD-10-CM | POA: Diagnosis not present

## 2014-09-30 DIAGNOSIS — R102 Pelvic and perineal pain: Secondary | ICD-10-CM | POA: Insufficient documentation

## 2014-09-30 HISTORY — PX: LAPAROSCOPY: SHX197

## 2014-09-30 HISTORY — DX: Gastro-esophageal reflux disease without esophagitis: K21.9

## 2014-09-30 HISTORY — PX: OVARIAN CYST REMOVAL: SHX89

## 2014-09-30 LAB — CBC
HCT: 45.3 % (ref 35.0–47.0)
Hemoglobin: 15.5 g/dL (ref 12.0–16.0)
MCH: 30.4 pg (ref 26.0–34.0)
MCHC: 34.3 g/dL (ref 32.0–36.0)
MCV: 88.8 fL (ref 80.0–100.0)
Platelets: 250 10*3/uL (ref 150–440)
RBC: 5.1 MIL/uL (ref 3.80–5.20)
RDW: 12.7 % (ref 11.5–14.5)
WBC: 8.9 10*3/uL (ref 3.6–11.0)

## 2014-09-30 LAB — TYPE AND SCREEN
ABO/RH(D): A POS
Antibody Screen: NEGATIVE

## 2014-09-30 LAB — POCT PREGNANCY, URINE: Preg Test, Ur: NEGATIVE

## 2014-09-30 LAB — ABO/RH: ABO/RH(D): A POS

## 2014-09-30 LAB — POTASSIUM: Potassium: 3.1 mmol/L — ABNORMAL LOW (ref 3.5–5.1)

## 2014-09-30 SURGERY — LAPAROSCOPY OPERATIVE
Anesthesia: General | Laterality: Right

## 2014-09-30 MED ORDER — PROPOFOL 10 MG/ML IV BOLUS
INTRAVENOUS | Status: DC | PRN
  Administered 2014-09-30: 200 mg via INTRAVENOUS

## 2014-09-30 MED ORDER — DEXAMETHASONE SODIUM PHOSPHATE 4 MG/ML IJ SOLN
INTRAMUSCULAR | Status: DC | PRN
  Administered 2014-09-30: 10 mg via INTRAVENOUS

## 2014-09-30 MED ORDER — PROMETHAZINE HCL 25 MG/ML IJ SOLN: 6.2500 mg | INTRAMUSCULAR | Status: DC | PRN

## 2014-09-30 MED ORDER — FENTANYL CITRATE (PF) 100 MCG/2ML IJ SOLN
INTRAMUSCULAR | Status: AC
  Administered 2014-09-30: 25 ug via INTRAVENOUS
  Filled 2014-09-30: qty 2

## 2014-09-30 MED ORDER — MIDAZOLAM HCL 2 MG/2ML IJ SOLN
INTRAMUSCULAR | Status: DC | PRN
  Administered 2014-09-30: 2 mg via INTRAVENOUS

## 2014-09-30 MED ORDER — OXYCODONE-ACETAMINOPHEN 5-325 MG PO TABS: 1.0000 | ORAL_TABLET | ORAL | Status: DC | PRN

## 2014-09-30 MED ORDER — BUPIVACAINE HCL (PF) 0.5 % IJ SOLN
INTRAMUSCULAR | Status: DC | PRN
  Administered 2014-09-30: 5 mL

## 2014-09-30 MED ORDER — ONDANSETRON HCL 4 MG/2ML IJ SOLN
INTRAMUSCULAR | Status: DC | PRN
  Administered 2014-09-30: 4 mg via INTRAVENOUS

## 2014-09-30 MED ORDER — FENTANYL CITRATE (PF) 100 MCG/2ML IJ SOLN
INTRAMUSCULAR | Status: DC | PRN
  Administered 2014-09-30: 100 ug via INTRAVENOUS
  Administered 2014-09-30: 50 ug via INTRAVENOUS

## 2014-09-30 MED ORDER — ROCURONIUM BROMIDE 100 MG/10ML IV SOLN
INTRAVENOUS | Status: DC | PRN
  Administered 2014-09-30 (×2): 20 mg via INTRAVENOUS

## 2014-09-30 MED ORDER — LIDOCAINE HCL (CARDIAC) 20 MG/ML IV SOLN
INTRAVENOUS | Status: DC | PRN
  Administered 2014-09-30: 100 mg via INTRAVENOUS

## 2014-09-30 MED ORDER — SUGAMMADEX SODIUM 200 MG/2ML IV SOLN
INTRAVENOUS | Status: DC | PRN
  Administered 2014-09-30: 200 mg via INTRAVENOUS

## 2014-09-30 MED ORDER — FENTANYL CITRATE (PF) 100 MCG/2ML IJ SOLN
25.0000 ug | INTRAMUSCULAR | Status: DC | PRN
  Administered 2014-09-30 (×4): 25 ug via INTRAVENOUS

## 2014-09-30 MED ORDER — BUPIVACAINE HCL (PF) 0.5 % IJ SOLN
INTRAMUSCULAR | Status: AC
  Filled 2014-09-30: qty 30

## 2014-09-30 MED ORDER — LACTATED RINGERS IV SOLN
INTRAVENOUS | Status: DC
  Administered 2014-09-30: 12:00:00 via INTRAVENOUS

## 2014-09-30 MED ORDER — FAMOTIDINE 20 MG PO TABS
ORAL_TABLET | ORAL | Status: AC
  Filled 2014-09-30: qty 1

## 2014-09-30 SURGICAL SUPPLY — 33 items
BLADE SURG SZ11 CARB STEEL (BLADE) ×3 IMPLANT
CANISTER SUCT 1200ML W/VALVE (MISCELLANEOUS) ×3 IMPLANT
CATH ROBINSON RED A/P 16FR (CATHETERS) ×3 IMPLANT
CHLORAPREP W/TINT 26ML (MISCELLANEOUS) ×3 IMPLANT
DRESSING TELFA 4X3 1S ST N-ADH (GAUZE/BANDAGES/DRESSINGS) IMPLANT
ENDOPOUCH RETRIEVER 10 (MISCELLANEOUS) IMPLANT
GAUZE SPONGE NON-WVN 2X2 STRL (MISCELLANEOUS) IMPLANT
GLOVE BIO SURGEON STRL SZ8 (GLOVE) ×3 IMPLANT
GLOVE INDICATOR 7.5 STRL GRN (GLOVE) ×3 IMPLANT
GOWN STRL REUS W/ TWL LRG LVL3 (GOWN DISPOSABLE) ×1 IMPLANT
GOWN STRL REUS W/ TWL XL LVL3 (GOWN DISPOSABLE) ×1 IMPLANT
GOWN STRL REUS W/TWL LRG LVL3 (GOWN DISPOSABLE) ×2
GOWN STRL REUS W/TWL XL LVL3 (GOWN DISPOSABLE) ×2
IRRIGATION STRYKERFLOW (MISCELLANEOUS) ×1 IMPLANT
IRRIGATOR STRYKERFLOW (MISCELLANEOUS) ×3
IV LACTATED RINGERS 1000ML (IV SOLUTION) ×3 IMPLANT
LABEL OR SOLS (LABEL) ×3 IMPLANT
LIQUID BAND (GAUZE/BANDAGES/DRESSINGS) ×3 IMPLANT
NEEDLE VERESS 14GA 120MM (NEEDLE) ×3 IMPLANT
NS IRRIG 500ML POUR BTL (IV SOLUTION) ×3 IMPLANT
PACK GYN LAPAROSCOPIC (MISCELLANEOUS) ×3 IMPLANT
PAD PREP 24X41 OB/GYN DISP (PERSONAL CARE ITEMS) ×3 IMPLANT
SCISSORS METZENBAUM CVD 33 (INSTRUMENTS) IMPLANT
SHEARS HARMONIC ACE PLUS 36CM (ENDOMECHANICALS) IMPLANT
SLEEVE ENDOPATH XCEL 5M (ENDOMECHANICALS) IMPLANT
SPONGE VERSALON 2X2 STRL (MISCELLANEOUS)
STRAP SAFETY BODY (MISCELLANEOUS) ×3 IMPLANT
SUT VIC AB 2-0 UR6 27 (SUTURE) ×3 IMPLANT
SUT VIC AB 4-0 PS2 18 (SUTURE) ×3 IMPLANT
SYRINGE 10CC LL (SYRINGE) ×3 IMPLANT
TROCAR ENDO BLADELESS 11MM (ENDOMECHANICALS) IMPLANT
TROCAR XCEL NON-BLD 5MMX100MML (ENDOMECHANICALS) ×3 IMPLANT
TUBING INSUFFLATOR HI FLOW (MISCELLANEOUS) ×3 IMPLANT

## 2014-09-30 NOTE — Transfer of Care (Signed)
Immediate Anesthesia Transfer of Care Note  Patient: Andrea Harmon  Procedure(s) Performed: Procedure(s): LAPAROSCOPY OPERATIVE (Right) OVARIAN CYSTECTOMY (Right)  Patient Location: PACU  Anesthesia Type:General  Level of Consciousness: awake, alert , oriented and patient cooperative  Airway & Oxygen Therapy: Patient Spontanous Breathing and Patient connected to face mask oxygen  Post-op Assessment: Report given to RN, Post -op Vital signs reviewed and stable and Patient moving all extremities X 4  Post vital signs: Reviewed and stable  Last Vitals:  Filed Vitals:   09/30/14 1301  BP: 128/81  Pulse: 75  Temp: 36.7 C  Resp: 16    Complications: No apparent anesthesia complications

## 2014-09-30 NOTE — Anesthesia Procedure Notes (Signed)
Procedure Name: Intubation Date/Time: 09/30/2014 12:07 PM Performed by: Michaele Offer Pre-anesthesia Checklist: Patient identified, Emergency Drugs available, Suction available, Patient being monitored and Timeout performed Patient Re-evaluated:Patient Re-evaluated prior to inductionOxygen Delivery Method: Circle system utilized Preoxygenation: Pre-oxygenation with 100% oxygen Intubation Type: IV induction Ventilation: Mask ventilation without difficulty Laryngoscope Size: Mac and 3 Grade View: Grade I Tube type: Oral Tube size: 7.0 mm Number of attempts: 1 Airway Equipment and Method: Rigid stylet Placement Confirmation: ETT inserted through vocal cords under direct vision,  positive ETCO2 and breath sounds checked- equal and bilateral Secured at: 21 cm Tube secured with: Tape Dental Injury: Teeth and Oropharynx as per pre-operative assessment

## 2014-09-30 NOTE — Op Note (Signed)
  Operative Note   09/30/2014  PRE-OP DIAGNOSIS: Right Ovarian Cyst, Pelvic Pain   POST-OP DIAGNOSIS: same   PROCEDURE: Procedure(s): LAPAROSCOPY - OPERATIVE RIGHT OVARIAN CYSTECTOMY   SURGEON: Annamarie Major, MD, FACOG  ANESTHESIA: Choice   ESTIMATED BLOOD LOSS: Min  COMPLICATIONS: none  DISPOSITION: PACU - hemodynamically stable.  CONDITION: stable  FINDINGS: Laparoscopic survey of the abdomen revealed a grossly normal uterus, tubes, ovaries with small ruptured right ovarian cyst, liver edge, gallbladder edge and appendix, No intra-abdominal adhesions were noted. Right Ovarian Cyst noted.  PROCEDURE IN DETAIL: The patient was taken to the OR where anesthesia was administed. The patient was positioned in dorsal lithotomy in the [place]lee stirrups. The patient was then examined under anesthesia with the above noted findings. The patient was prepped and draped in the normal sterile fashion and foley catheter was placed. A Graves speculum was placed in the vagina and the anterior lip of the cervix was grasped with a single toothed tenaculum. A Hulka uterine manipulator was then inserted in the uterus. Uterine mobility was found to be satisfactory. The speculum was then removed.  Attention was turned to the patient's abdomen where a 5 mm skin incision was made in the umbilical fold, after injection of local anesthesia. The Veress step needle was carefully introduced into the peritoneal cavity with placement confirmed using the hanging drop technique.  Pneumoperitoneum was obtained. The 5 mm port was then placed under direct visualization with the operative laparoscope.  Trendelenburg positioning.  Additional 5mm trocar was then placed in the LLQ lateral to the inferior epigastric blood vessels under direct visualization with the laparoscope.  Instrumentation to visualize complete pelvic anatomy performed.   The ovarian cyst is identified and stabilized.  An incision is made with min fluid noted at  site if ruptured cyst and cyst wall is extracted (small amount).   Cyst wall is dissected free from the ovarian cortex and removed.  Hemostasis is visualized and assured.  Contralateral ovary seen as normal.  Pelvic cavity is cleaned with any fluid aspirated.  Instruments and trocars removed, gas expelled, and skin closed with skin adhesive glue.  Instrument, needle, and sponge counts correct x2 at the conclusion of the case.  Pt goes to recovery room in stable condition.

## 2014-09-30 NOTE — H&P (Signed)
History and Physical Interval Note:  09/30/2014 10:49 AM  Andrea Harmon  has presented today for surgery, with the diagnosis of RIGHT LOWER QUADRANT PAIN  The various methods of treatment have been discussed with the patient and family. After consideration of risks, benefits and other options for treatment, the patient has consented to  Procedure(s): LAPAROSCOPY OPERATIVE and OVARIAN CYSTECTOMY as a surgical intervention .  The patient's history has been reviewed, patient examined, no change in status, stable for surgery.  Pt has the following beta blocker history-  Not taking Beta Blocker.  I have reviewed the patient's chart and labs.  Questions were answered to the patient's satisfaction.       Armida Vickroy Renae Fickle

## 2014-09-30 NOTE — Discharge Instructions (Signed)
General Gynecological Post-Operative Instructions °You may expect to feel dizzy, weak, and drowsy for as long as 24 hours after receiving the medicine that made you sleep (anesthetic).  °Do not drive a car, ride a bicycle, participate in physical activities, or take public transportation until you are done taking narcotic pain medicines or as directed by your doctor.  °Do not drink alcohol or take tranquilizers.  °Do not take medicine that has not been prescribed by your doctor.  °Do not sign important papers or make important decisions while on narcotic pain medicines.  °Have a responsible person with you.  °CARE OF INCISION  °Keep incision clean and dry. °Take showers instead of baths until your doctor gives you permission to take baths.  °Avoid heavy lifting (more than 10 pounds/4.5 kilograms), pushing, or pulling.  °Avoid activities that may risk injury to your surgical site.  °No sexual intercourse or placement of anything in the vagina for 1 weeks or as instructed by your doctor. °If you have tubes coming from the wound site, check with your doctor regarding appropriate care of the tubes. °Only take prescription or over-the-counter medicines  for pain, discomfort, or fever as directed by your doctor. Do not take aspirin. It can make you bleed. Take medicines (antibiotics) that kill germs if they are prescribed for you.  °Call the office or go to the ER if:  °You feel sick to your stomach (nauseous) and you start to throw up (vomit).  °You have trouble eating or drinking.  °You have an oral temperature above 101.  °You have constipation that is not helped by adjusting diet or increasing fluid intake. Pain medicines are a common cause of constipation.  °You have any other concerns. °SEEK IMMEDIATE MEDICAL CARE IF:  °You have persistent dizziness.  °You have difficulty breathing or a congested sounding (croupy) cough.  °You have an oral temperature above 102.5, not controlled by medicine.  °There is increasing  pain or tenderness near or in the surgical site.  ° ° ° °

## 2014-09-30 NOTE — Anesthesia Preprocedure Evaluation (Addendum)
Anesthesia Evaluation  Patient identified by MRN, date of birth, ID band Patient awake    Reviewed: Allergy & Precautions, H&P , NPO status , Patient's Chart, lab work & pertinent test results, reviewed documented beta blocker date and time   History of Anesthesia Complications Negative for: history of anesthetic complications  Airway Mallampati: III  TM Distance: >3 FB Neck ROM: full    Dental no notable dental hx. (+) Edentulous Upper, Upper Dentures   Pulmonary neg shortness of breath, asthma , neg sleep apnea, neg recent URI,  breath sounds clear to auscultation  Pulmonary exam normal       Cardiovascular Exercise Tolerance: Good hypertension, On Medications - angina- CAD, - Past MI and - CABG Normal cardiovascular exam- dysrhythmias - Valvular Problems/MurmursRhythm:regular Rate:Normal     Neuro/Psych  Headaches, negative psych ROS   GI/Hepatic Neg liver ROS, GERD-  Medicated and Controlled,  Endo/Other  negative endocrine ROS  Renal/GU negative Renal ROS  negative genitourinary   Musculoskeletal   Abdominal   Peds  Hematology negative hematology ROS (+)   Anesthesia Other Findings Past Medical History:   Hypertension                                                 Ankle fracture, left                                         Migraine                                                     Asthma                                                         Comment:NO INHALER CURRETNLY   GERD (gastroesophageal reflux disease)                       Reproductive/Obstetrics negative OB ROS                            Anesthesia Physical Anesthesia Plan  ASA: II  Anesthesia Plan: General   Post-op Pain Management:    Induction:   Airway Management Planned:   Additional Equipment:   Intra-op Plan:   Post-operative Plan:   Informed Consent: I have reviewed the patients History and  Physical, chart, labs and discussed the procedure including the risks, benefits and alternatives for the proposed anesthesia with the patient or authorized representative who has indicated his/her understanding and acceptance.   Dental Advisory Given  Plan Discussed with: Anesthesiologist, CRNA and Surgeon  Anesthesia Plan Comments:         Anesthesia Quick Evaluation

## 2014-10-01 LAB — SURGICAL PATHOLOGY

## 2014-10-01 NOTE — Anesthesia Postprocedure Evaluation (Signed)
  Anesthesia Post-op Note  Patient: Andrea Harmon  Procedure(s) Performed: Procedure(s): LAPAROSCOPY OPERATIVE (Right) OVARIAN CYSTECTOMY (Right)  Anesthesia type:General  Patient location: PACU  Post pain: Pain level controlled  Post assessment: Post-op Vital signs reviewed, Patient's Cardiovascular Status Stable, Respiratory Function Stable, Patent Airway and No signs of Nausea or vomiting  Post vital signs: Reviewed and stable  Last Vitals:  Filed Vitals:   09/30/14 1407  BP: 113/78  Pulse: 67  Temp:   Resp:     Level of consciousness: awake, alert  and patient cooperative  Complications: No apparent anesthesia complications

## 2014-10-04 ENCOUNTER — Encounter: Payer: Self-pay | Admitting: Emergency Medicine

## 2014-10-04 ENCOUNTER — Emergency Department
Admission: EM | Admit: 2014-10-04 | Discharge: 2014-10-05 | Disposition: A | Discharge status: Home / Self Care | Payer: Medicaid Other | Attending: Emergency Medicine | Admitting: Emergency Medicine

## 2014-10-04 DIAGNOSIS — Z3202 Encounter for pregnancy test, result negative: Secondary | ICD-10-CM | POA: Insufficient documentation

## 2014-10-04 DIAGNOSIS — Z79899 Other long term (current) drug therapy: Secondary | ICD-10-CM | POA: Diagnosis not present

## 2014-10-04 DIAGNOSIS — N3090 Cystitis, unspecified without hematuria: Secondary | ICD-10-CM

## 2014-10-04 DIAGNOSIS — R1010 Upper abdominal pain, unspecified: Secondary | ICD-10-CM | POA: Diagnosis present

## 2014-10-04 DIAGNOSIS — B349 Viral infection, unspecified: Secondary | ICD-10-CM

## 2014-10-04 DIAGNOSIS — I10 Essential (primary) hypertension: Secondary | ICD-10-CM | POA: Diagnosis not present

## 2014-10-04 LAB — COMPREHENSIVE METABOLIC PANEL
ALK PHOS: 78 U/L (ref 38–126)
ALT: 32 U/L (ref 14–54)
ANION GAP: 7 (ref 5–15)
AST: 24 U/L (ref 15–41)
Albumin: 4.4 g/dL (ref 3.5–5.0)
BILIRUBIN TOTAL: 0.3 mg/dL (ref 0.3–1.2)
BUN: 12 mg/dL (ref 6–20)
CALCIUM: 10.6 mg/dL — AB (ref 8.9–10.3)
CO2: 29 mmol/L (ref 22–32)
Chloride: 106 mmol/L (ref 101–111)
Creatinine, Ser: 0.87 mg/dL (ref 0.44–1.00)
GFR calc non Af Amer: >60 mL/min (ref >60)
Glucose, Bld: 125 mg/dL — ABNORMAL HIGH (ref 65–99)
Potassium: 3.3 mmol/L — ABNORMAL LOW (ref 3.5–5.1)
SODIUM: 142 mmol/L (ref 135–145)
TOTAL PROTEIN: 7.6 g/dL (ref 6.5–8.1)

## 2014-10-04 LAB — CBC
HCT: 43.8 % (ref 35.0–47.0)
HEMOGLOBIN: 15 g/dL (ref 12.0–16.0)
MCH: 30.3 pg (ref 26.0–34.0)
MCHC: 34.2 g/dL (ref 32.0–36.0)
MCV: 88.6 fL (ref 80.0–100.0)
Platelets: 250 10*3/uL (ref 150–440)
RBC: 4.94 MIL/uL (ref 3.80–5.20)
RDW: 12.6 % (ref 11.5–14.5)
WBC: 13.5 10*3/uL — ABNORMAL HIGH (ref 3.6–11.0)

## 2014-10-04 LAB — LIPASE, BLOOD: Lipase: 16 U/L — ABNORMAL LOW (ref 22–51)

## 2014-10-04 MED ORDER — ONDANSETRON HCL 4 MG/2ML IJ SOLN
4.0000 mg | Freq: Once | INTRAMUSCULAR | Status: AC
  Administered 2014-10-04: 4 mg via INTRAVENOUS
  Filled 2014-10-04: qty 2

## 2014-10-04 MED ORDER — ONDANSETRON HCL 4 MG/2ML IJ SOLN: 4.0000 mg | Freq: Once | INTRAMUSCULAR | Status: DC | PRN

## 2014-10-04 MED ORDER — SODIUM CHLORIDE 0.9 % IV SOLN
1000.0000 mL | Freq: Once | INTRAVENOUS | Status: AC
  Administered 2014-10-04: 1000 mL via INTRAVENOUS

## 2014-10-04 NOTE — ED Notes (Signed)
Pt informed that urine specimen needed for test, states not able now but will try again.  

## 2014-10-04 NOTE — ED Notes (Signed)
Pt reports vomiting since 630 pm today. Pt reports cyst removed on Tuesday. Reports being sore to area of surgery. Denies diarrhea. +dysuria.

## 2014-10-05 LAB — URINALYSIS COMPLETE WITH MICROSCOPIC (ARMC ONLY)
Bilirubin Urine: NEGATIVE
Glucose, UA: 50 mg/dL — AB
Hgb urine dipstick: NEGATIVE
Nitrite: NEGATIVE
Protein, ur: NEGATIVE mg/dL
Specific Gravity, Urine: 1.005 (ref 1.005–1.030)
pH: 9 — ABNORMAL HIGH (ref 5.0–8.0)

## 2014-10-05 LAB — PREGNANCY, URINE: Preg Test, Ur: NEGATIVE

## 2014-10-05 LAB — POCT PREGNANCY, URINE: Preg Test, Ur: NEGATIVE

## 2014-10-05 MED ORDER — FAMOTIDINE 20 MG PO TABS
40.0000 mg | ORAL_TABLET | Freq: Once | ORAL | Status: AC
  Administered 2014-10-05: 40 mg via ORAL
  Filled 2014-10-05: qty 2

## 2014-10-05 MED ORDER — ONDANSETRON 8 MG PO TBDP: 8.0000 mg | ORAL_TABLET | Freq: Three times a day (TID) | ORAL | Status: DC | PRN

## 2014-10-05 MED ORDER — GI COCKTAIL ~~LOC~~
30.0000 mL | ORAL | Status: AC
Start: 2014-10-05 — End: 2014-10-05
  Administered 2014-10-05: 30 mL via ORAL
  Filled 2014-10-05: qty 30

## 2014-10-05 MED ORDER — KETOROLAC TROMETHAMINE 30 MG/ML IJ SOLN
30.0000 mg | Freq: Once | INTRAMUSCULAR | Status: AC
  Administered 2014-10-05: 30 mg via INTRAVENOUS
  Filled 2014-10-05: qty 1

## 2014-10-05 MED ORDER — NITROFURANTOIN MACROCRYSTAL 100 MG PO CAPS: 100.0000 mg | ORAL_CAPSULE | Freq: Two times a day (BID) | ORAL | Status: DC

## 2014-10-05 MED ORDER — RANITIDINE HCL 150 MG PO CAPS: 150.0000 mg | ORAL_CAPSULE | Freq: Two times a day (BID) | ORAL | Status: DC

## 2014-10-05 NOTE — Discharge Instructions (Signed)
Urinary Tract Infection Urinary tract infections (UTIs) can develop anywhere along your urinary tract. Your urinary tract is your body's drainage system for removing wastes and extra water. Your urinary tract includes two kidneys, two ureters, a bladder, and a urethra. Your kidneys are a pair of bean-shaped organs. Each kidney is about the size of your fist. They are located below your ribs, one on each side of your spine. CAUSES Infections are caused by microbes, which are microscopic organisms, including fungi, viruses, and bacteria. These organisms are so small that they can only be seen through a microscope. Bacteria are the microbes that most commonly cause UTIs. SYMPTOMS  Symptoms of UTIs may vary by age and gender of the patient and by the location of the infection. Symptoms in young women typically include a frequent and intense urge to urinate and a painful, burning feeling in the bladder or urethra during urination. Older women and men are more likely to be tired, shaky, and weak and have muscle aches and abdominal pain. A fever may mean the infection is in your kidneys. Other symptoms of a kidney infection include pain in your back or sides below the ribs, nausea, and vomiting. DIAGNOSIS To diagnose a UTI, your caregiver will ask you about your symptoms. Your caregiver also will ask to provide a urine sample. The urine sample will be tested for bacteria and white blood cells. White blood cells are made by your body to help fight infection. TREATMENT  Typically, UTIs can be treated with medication. Because most UTIs are caused by a bacterial infection, they usually can be treated with the use of antibiotics. The choice of antibiotic and length of treatment depend on your symptoms and the type of bacteria causing your infection. HOME CARE INSTRUCTIONS  If you were prescribed antibiotics, take them exactly as your caregiver instructs you. Finish the medication even if you feel better after you  have only taken some of the medication.  Drink enough water and fluids to keep your urine clear or pale yellow.  Avoid caffeine, tea, and carbonated beverages. They tend to irritate your bladder.  Empty your bladder often. Avoid holding urine for long periods of time.  Empty your bladder before and after sexual intercourse.  After a bowel movement, women should cleanse from front to back. Use each tissue only once. SEEK MEDICAL CARE IF:   You have back pain.  You develop a fever.  Your symptoms do not begin to resolve within 3 days. SEEK IMMEDIATE MEDICAL CARE IF:   You have severe back pain or lower abdominal pain.  You develop chills.  You have nausea or vomiting.  You have continued burning or discomfort with urination. MAKE SURE YOU:   Understand these instructions.  Will watch your condition.  Will get help right away if you are not doing well or get worse. Document Released: 11/10/2004 Document Revised: 08/02/2011 Document Reviewed: 03/11/2011 [facility] Patient Information 2015 [place], [place]. This information is not intended to replace advice given to you by your health care provider. Make sure you discuss any questions you have with your health care provider.  Viral Infections A viral infection can be caused by different types of viruses.Most viral infections are not serious and resolve on their own. However, some infections may cause severe symptoms and may lead to further complications. SYMPTOMS Viruses can frequently cause:  Minor sore throat.  Aches and pains.  Headaches.  Runny nose.  Different types of rashes.  Watery eyes.  Tiredness.  Cough.  Loss of appetite.  Gastrointestinal infections, resulting in nausea, vomiting, and diarrhea. These symptoms do not respond to antibiotics because the infection is not caused by bacteria. However, you might catch a bacterial infection following the viral infection. This is sometimes called a  "superinfection." Symptoms of such a bacterial infection may include:  Worsening sore throat with pus and difficulty swallowing.  Swollen neck glands.  Chills and a high or persistent fever.  Severe headache.  Tenderness over the sinuses.  Persistent overall ill feeling (malaise), muscle aches, and tiredness (fatigue).  Persistent cough.  Yellow, green, or brown mucus production with coughing. HOME CARE INSTRUCTIONS   Only take over-the-counter or prescription medicines for pain, discomfort, diarrhea, or fever as directed by your caregiver.  Drink enough water and fluids to keep your urine clear or pale yellow. Sports drinks can provide valuable electrolytes, sugars, and hydration.  Get plenty of rest and maintain proper nutrition. Soups and broths with crackers or rice are fine. SEEK IMMEDIATE MEDICAL CARE IF:   You have severe headaches, shortness of breath, chest pain, neck pain, or an unusual rash.  You have uncontrolled vomiting, diarrhea, or you are unable to keep down fluids.  You or your child has an oral temperature above 102 F (38.9 C), not controlled by medicine.  Your baby is older than 3 months with a rectal temperature of 102 F (38.9 C) or higher.  Your baby is 26 months old or younger with a rectal temperature of 100.4 F (38 C) or higher. MAKE SURE YOU:   Understand these instructions.  Will watch your condition.  Will get help right away if you are not doing well or get worse. Document Released: 11/10/2004 Document Revised: 04/25/2011 Document Reviewed: 06/07/2010 [facility] Patient Information 2015 [place], [place]. This information is not intended to replace advice given to you by your health care provider. Make sure you discuss any questions you have with your health care provider.

## 2014-10-05 NOTE — ED Provider Notes (Signed)
[facility] Emergency Department Provider Note  ____________________________________________  Time seen: 11:15 PM  I have reviewed the triage vital signs and the nursing notes.   HISTORY  Chief Complaint Vomiting    HPI Andrea Harmon is a 34 y.o. female who complains of upper abdominal pain for about 5 hours. She is having nausea and vomiting with this and that is worsening with eating. She also notes that she's been having dysuria and frequency and urgency for the past week that seems to be worsening. Denies hematuria or back pain or flank pain. No fevers or chills.  She recently had a laparoscopic ovarian cystectomy 4 days ago. She does note that over the last 24 hours she's had runny nose nonproductive cough and sore throat as well.   Past Medical History  Diagnosis Date  . Hypertension   . Ankle fracture, left   . Migraine   . Asthma     NO INHALER CURRETNLY  . GERD (gastroesophageal reflux disease)     Patient Active Problem List   Diagnosis Date Noted  . Pelvic pain in female 04/25/2012  . Other and unspecified ovarian cyst 04/25/2012    Past Surgical History  Procedure Laterality Date  . Tubal ligation    . Dnc    . Dilation and curettage of uterus    . Mouth surgery    . Laparoscopy Right 09/30/2014    Procedure: LAPAROSCOPY OPERATIVE;  Surgeon: Nadara Mustard, MD;  Location: [facility] ORS;  Service: Gynecology;  Laterality: Right;  . Ovarian cyst removal Right 09/30/2014    Procedure: OVARIAN CYSTECTOMY;  Surgeon: Nadara Mustard, MD;  Location: [facility] ORS;  Service: Gynecology;  Laterality: Right;    Current Outpatient Rx  Name  Route  Sig  Dispense  Refill  . amLODipine (NORVASC) 5 MG tablet   Oral   Take 1 tablet (5 mg total) by mouth daily. Patient taking differently: Take 5 mg by mouth every morning.    30 tablet   0   . nitrofurantoin (MACRODANTIN) 100 MG capsule   Oral   Take 1 capsule (100 mg total) by mouth 2 (two) times  daily.   6 capsule   0   . ondansetron (ZOFRAN ODT) 8 MG disintegrating tablet   Oral   Take 1 tablet (8 mg total) by mouth every 8 (eight) hours as needed for nausea or vomiting.   20 tablet   0   . ranitidine (ZANTAC) 150 MG capsule   Oral   Take 1 capsule (150 mg total) by mouth 2 (two) times daily.   28 capsule   0     Allergies Review of patient's allergies indicates no known allergies.  Family History  Problem Relation Age of Onset  . Hypertension Mother   . Cancer Maternal Grandfather     pancreas    Social History Social History  Substance Use Topics  . Smoking status: Never Smoker   . Smokeless tobacco: Never Used  . Alcohol Use: No    Review of Systems  Constitutional: No fever or chills. No weight changes Eyes:No blurry vision or double vision.  ENT: No sore throat. Cardiovascular: No chest pain. Respiratory: No dyspnea or cough. Gastrointestinal: Positive upper abdominal pain with vomiting. No diarrhea.  No BRBPR or melena. Genitourinary: Positive for dysuria frequency and urgency. No vaginal bleeding or discharge.. Musculoskeletal: Negative for back pain. No joint swelling or pain. Skin: Negative for rash. Neurological: Negative for headaches, focal weakness or  numbness. Psychiatric:No anxiety or depression.   Endocrine:No hot/cold intolerance, changes in energy, or sleep difficulty.  10-point ROS otherwise negative.  ____________________________________________   PHYSICAL EXAM:  VITAL SIGNS: ED Triage Vitals  Enc Vitals Group     BP 10/04/14 2141 162/117 mmHg     Pulse Rate 10/04/14 2141 109     Resp 10/04/14 2141 20     Temp 10/04/14 2141 98.1 F (36.7 C)     Temp Source 10/04/14 2141 Oral     SpO2 10/04/14 2141 97 %     Weight 10/04/14 2141 220 lb (99.791 kg)     Height 10/04/14 2141 5\' 4"  (1.626 m)     Head Cir --      Peak Flow --      Pain Score 10/04/14 2143 7     Pain Loc --      Pain Edu? --      Excl. in GC? --       Constitutional: Alert and oriented. Well appearing and in no distress. Eyes: No scleral icterus. No conjunctival pallor. PERRL. EOMI ENT   Head: Normocephalic and atraumatic.   Nose: No congestion/rhinnorhea. No septal hematoma   Mouth/Throat: MMM, mild pharyngeal erythema. No peritonsillar mass. No uvula shift.   Neck: No stridor. No SubQ emphysema. No meningismus. Hematological/Lymphatic/Immunilogical: No cervical lymphadenopathy. Cardiovascular: RRR. Normal and symmetric distal pulses are present in all extremities. No murmurs, rubs, or gallops. Respiratory: Normal respiratory effort without tachypnea nor retractions. Breath sounds are clear and equal bilaterally. No wheezes/rales/rhonchi. Gastrointestinal: Soft with mild epigastric tenderness. There is also moderate tenderness in the suprapubic area. There is absolutely no right lower quadrant tenderness with even deep palpation.. No distention. There is no CVA tenderness.  No rebound, rigidity, or guarding. Genitourinary: deferred Musculoskeletal: Nontender with normal range of motion in all extremities. No joint effusions.  No lower extremity tenderness.  No edema. Neurologic:   Normal speech and language.  CN 2-10 normal. Motor grossly intact. Normal gait. No gross focal neurologic deficits are appreciated.  Skin:  Skin is warm, dry and intact. No rash noted.  No petechiae, purpura, or bullae. Psychiatric: Mood and affect are normal. Speech and behavior are normal. Patient exhibits appropriate insight and judgment.  ____________________________________________    LABS (pertinent positives/negatives) (all labs ordered are listed, but only abnormal results are displayed) Labs Reviewed  CBC - Abnormal; Notable for the following:    WBC 13.5 (*)    All other components within normal limits  COMPREHENSIVE METABOLIC PANEL - Abnormal; Notable for the following:    Potassium 3.3 (*)    Glucose, Bld 125 (*)     Calcium 10.6 (*)    All other components within normal limits  LIPASE, BLOOD - Abnormal; Notable for the following:    Lipase 16 (*)    All other components within normal limits  URINALYSIS COMPLETEWITH MICROSCOPIC ([facility] ONLY) - Abnormal; Notable for the following:    Color, Urine COLORLESS (*)    APPearance CLEAR (*)    Glucose, UA 50 (*)    Ketones, ur TRACE (*)    pH 9.0 (*)    Leukocytes, UA 1+ (*)    Bacteria, UA FEW (*)    Squamous Epithelial / LPF 0-5 (*)    All other components within normal limits  URINE CULTURE  PREGNANCY, URINE   ____________________________________________   EKG    ____________________________________________    RADIOLOGY    ____________________________________________   PROCEDURES  ____________________________________________  INITIAL IMPRESSION / ASSESSMENT AND PLAN / ED COURSE  Pertinent labs & imaging results that were available during my care of the patient were reviewed by me and considered in my medical decision making (see chart for details).  Patient presents with symptoms and physical exam consistent with viral syndrome, likely due to in-hospital or community exposure to a routine viral illness. She is otherwise well-appearing without distress. We'll check labs and give GI cocktail for symptom relief. Also check her urinalysis due to the dysuria and frequency and urgency with suprapubic tenderness.  ----------------------------------------- 3:09 AM on 10/05/2014 -----------------------------------------  Urinalysis is suggestive of urinary tract infection which we'll go ahead and treat with Macrobid given her symptoms and exam findings. I sent a culture as well. Otherwise labs are unremarkable and the patient is well-appearing with unremarkable vital signs. We'll plan to discharge her home with Zantac Zofran and Macrobid. She can follow up with primary care this week for further monitoring of her symptoms. Low suspicion for  surgical complication such as perforation or intra-abdominal abscess or appendicitis.  ____________________________________________   FINAL CLINICAL IMPRESSION(S) / ED DIAGNOSES  Final diagnoses:  Viral syndrome  Cystitis      Sharman Cheek, MD 10/05/14 [id number]

## 2014-10-07 LAB — URINE CULTURE: Culture: >=100000

## 2015-03-02 ENCOUNTER — Other Ambulatory Visit: Payer: Self-pay | Admitting: Student

## 2015-03-02 DIAGNOSIS — R1032 Left lower quadrant pain: Secondary | ICD-10-CM

## 2015-03-13 ENCOUNTER — Ambulatory Visit
Admission: RE | Admit: 2015-03-13 | Discharge: 2015-03-13 | Disposition: A | Discharge status: Home / Self Care | Payer: Medicaid Other | Source: Ambulatory Visit | Attending: Student | Admitting: Student

## 2015-03-13 DIAGNOSIS — K760 Fatty (change of) liver, not elsewhere classified: Secondary | ICD-10-CM | POA: Diagnosis not present

## 2015-03-13 DIAGNOSIS — R1032 Left lower quadrant pain: Secondary | ICD-10-CM | POA: Diagnosis not present

## 2015-03-13 DIAGNOSIS — N83202 Unspecified ovarian cyst, left side: Secondary | ICD-10-CM | POA: Insufficient documentation

## 2015-03-13 DIAGNOSIS — K429 Umbilical hernia without obstruction or gangrene: Secondary | ICD-10-CM | POA: Insufficient documentation

## 2015-03-13 HISTORY — DX: Heart failure, unspecified: I50.9

## 2015-03-13 MED ORDER — IOHEXOL 300 MG/ML  SOLN
100.0000 mL | Freq: Once | INTRAMUSCULAR | Status: AC | PRN
  Administered 2015-03-13: 100 mL via INTRAVENOUS

## 2015-04-03 ENCOUNTER — Encounter: Payer: Self-pay | Admitting: *Deleted

## 2015-04-06 ENCOUNTER — Encounter
Admission: RE | Disposition: A | Discharge status: Home / Self Care | Payer: Self-pay | Source: Ambulatory Visit | Attending: Gastroenterology

## 2015-04-06 ENCOUNTER — Ambulatory Visit: Payer: Medicaid Other | Admitting: Anesthesiology

## 2015-04-06 ENCOUNTER — Encounter: Payer: Self-pay | Admitting: *Deleted

## 2015-04-06 ENCOUNTER — Ambulatory Visit
Admission: RE | Admit: 2015-04-06 | Discharge: 2015-04-06 | Disposition: A | Discharge status: Home / Self Care | Payer: Medicaid Other | Source: Ambulatory Visit | Attending: Gastroenterology | Admitting: Gastroenterology

## 2015-04-06 DIAGNOSIS — Z6841 Body Mass Index (BMI) 40.0 and over, adult: Secondary | ICD-10-CM | POA: Insufficient documentation

## 2015-04-06 DIAGNOSIS — F319 Bipolar disorder, unspecified: Secondary | ICD-10-CM | POA: Diagnosis not present

## 2015-04-06 DIAGNOSIS — K219 Gastro-esophageal reflux disease without esophagitis: Secondary | ICD-10-CM | POA: Insufficient documentation

## 2015-04-06 DIAGNOSIS — I10 Essential (primary) hypertension: Secondary | ICD-10-CM | POA: Diagnosis not present

## 2015-04-06 DIAGNOSIS — R1013 Epigastric pain: Secondary | ICD-10-CM | POA: Diagnosis present

## 2015-04-06 DIAGNOSIS — Z79899 Other long term (current) drug therapy: Secondary | ICD-10-CM | POA: Diagnosis not present

## 2015-04-06 DIAGNOSIS — I509 Heart failure, unspecified: Secondary | ICD-10-CM | POA: Diagnosis not present

## 2015-04-06 DIAGNOSIS — J45909 Unspecified asthma, uncomplicated: Secondary | ICD-10-CM | POA: Insufficient documentation

## 2015-04-06 DIAGNOSIS — K2950 Unspecified chronic gastritis without bleeding: Secondary | ICD-10-CM | POA: Diagnosis not present

## 2015-04-06 DIAGNOSIS — Z885 Allergy status to narcotic agent status: Secondary | ICD-10-CM | POA: Diagnosis not present

## 2015-04-06 HISTORY — DX: Vitamin D deficiency, unspecified: E55.9

## 2015-04-06 HISTORY — DX: Depression, unspecified: F32.A

## 2015-04-06 HISTORY — DX: Fatty (change of) liver, not elsewhere classified: K76.0

## 2015-04-06 HISTORY — DX: Obesity, unspecified: E66.9

## 2015-04-06 HISTORY — DX: Bipolar disorder, unspecified: F31.9

## 2015-04-06 HISTORY — PX: ESOPHAGOGASTRODUODENOSCOPY (EGD) WITH PROPOFOL: SHX5813

## 2015-04-06 HISTORY — DX: Major depressive disorder, single episode, unspecified: F32.9

## 2015-04-06 LAB — POCT PREGNANCY, URINE: PREG TEST UR: NEGATIVE

## 2015-04-06 SURGERY — ESOPHAGOGASTRODUODENOSCOPY (EGD) WITH PROPOFOL
Anesthesia: General

## 2015-04-06 MED ORDER — SODIUM CHLORIDE 0.9 % IV SOLN
INTRAVENOUS | Status: DC
  Administered 2015-04-06: 14:00:00 via INTRAVENOUS

## 2015-04-06 MED ORDER — LACTATED RINGERS IV SOLN
INTRAVENOUS | Status: DC | PRN
  Administered 2015-04-06: 15:00:00 via INTRAVENOUS

## 2015-04-06 MED ORDER — PROPOFOL 10 MG/ML IV BOLUS
INTRAVENOUS | Status: DC | PRN
  Administered 2015-04-06: 50 mg via INTRAVENOUS
  Administered 2015-04-06: 100 mg via INTRAVENOUS

## 2015-04-06 NOTE — Anesthesia Preprocedure Evaluation (Signed)
Anesthesia Evaluation  Patient identified by MRN, date of birth, ID band Patient awake    Reviewed: Allergy & Precautions, H&P , NPO status , Patient's Chart, lab work & pertinent test results, reviewed documented beta blocker date and time   History of Anesthesia Complications Negative for: history of anesthetic complications  Airway Mallampati: IV  TM Distance: >3 FB Neck ROM: full    Dental no notable dental hx. (+) Missing, Poor Dentition   Pulmonary neg shortness of breath, asthma , neg sleep apnea, neg COPD, Recent URI ,    Pulmonary exam normal breath sounds clear to auscultation       Cardiovascular Exercise Tolerance: Good hypertension, On Medications (-) angina(-) CAD, (-) Past MI, (-) Cardiac Stents and (-) CABG Normal cardiovascular exam(-) dysrhythmias (-) Valvular Problems/Murmurs Rhythm:regular Rate:Normal     Neuro/Psych PSYCHIATRIC DISORDERS (Bipolar) negative neurological ROS     GI/Hepatic GERD  Medicated,NAFLD   Endo/Other  neg diabetesMorbid obesity  Renal/GU negative Renal ROS  negative genitourinary   Musculoskeletal   Abdominal   Peds  Hematology negative hematology ROS (+)   Anesthesia Other Findings Past Medical History:   Hypertension                                                 Ankle fracture, left                                         Migraine                                                     Asthma                                                         Comment:NO INHALER CURRETNLY   GERD (gastroesophageal reflux disease)                       CHF (congestive heart failure) (HCC)                         Bipolar disorder (HCC)                                       Depression                                                   Nonalcoholic fatty liver disease                             Obesity  Vitamin D deficiency                                          Reproductive/Obstetrics negative OB ROS                             Anesthesia Physical Anesthesia Plan  ASA: III  Anesthesia Plan: General   Post-op Pain Management:    Induction:   Airway Management Planned:   Additional Equipment:   Intra-op Plan:   Post-operative Plan:   Informed Consent: I have reviewed the patients History and Physical, chart, labs and discussed the procedure including the risks, benefits and alternatives for the proposed anesthesia with the patient or authorized representative who has indicated his/her understanding and acceptance.   Dental Advisory Given  Plan Discussed with: Anesthesiologist, CRNA and Surgeon  Anesthesia Plan Comments:         Anesthesia Quick Evaluation

## 2015-04-06 NOTE — Anesthesia Postprocedure Evaluation (Signed)
Anesthesia Post Note  Patient: Andrea Harmon  Procedure(s) Performed: Procedure(s) (LRB): ESOPHAGOGASTRODUODENOSCOPY (EGD) WITH PROPOFOL (N/A)  Patient location during evaluation: PACU Anesthesia Type: General Level of consciousness: awake and alert Pain management: pain level controlled Vital Signs Assessment: post-procedure vital signs reviewed and stable Respiratory status: spontaneous breathing, nonlabored ventilation, respiratory function stable and patient connected to nasal cannula oxygen Cardiovascular status: blood pressure returned to baseline and stable Postop Assessment: no signs of nausea or vomiting Anesthetic complications: no    Last Vitals:  Filed Vitals:   04/06/15 1514 04/06/15 1524  BP: 155/75 124/91  Pulse: 69 67  Temp:    Resp: 19 16    Last Pain:  Filed Vitals:   04/06/15 1529  PainSc: 4                  Lenard Simmer

## 2015-04-06 NOTE — H&P (Signed)
Primary Care Physician:  Evelene Croon, MD  Pre-Procedure History & Physical: HPI:  Andrea Harmon is a 35 y.o. female is here for an endoscopy.   Past Medical History  Diagnosis Date  . Hypertension   . Ankle fracture, left   . Migraine   . Asthma     NO INHALER CURRETNLY  . GERD (gastroesophageal reflux disease)   . CHF (congestive heart failure) (HCC)   . Bipolar disorder (HCC)   . Depression   . Nonalcoholic fatty liver disease   . Obesity   . Vitamin D deficiency     Past Surgical History  Procedure Laterality Date  . Tubal ligation    . Dnc    . Dilation and curettage of uterus    . Mouth surgery    . Laparoscopy Right 09/30/2014    Procedure: LAPAROSCOPY OPERATIVE;  Surgeon: Nadara Mustard, MD;  Location: [facility] ORS;  Service: Gynecology;  Laterality: Right;  . Ovarian cyst removal Right 09/30/2014    Procedure: OVARIAN CYSTECTOMY;  Surgeon: Nadara Mustard, MD;  Location: [facility] ORS;  Service: Gynecology;  Laterality: Right;    Prior to Admission medications   Medication Sig Start Date End Date Taking? Authorizing Provider  amLODipine (NORVASC) 5 MG tablet Take 1 tablet (5 mg total) by mouth daily. Patient taking differently: Take 5 mg by mouth every morning.  12/25/13  Yes Geoffery Lyons, MD  buPROPion (WELLBUTRIN SR) 150 MG 12 hr tablet Take 150 mg by mouth daily.   Yes Historical Provider, MD  dicyclomine (BENTYL) 10 MG capsule Take 10 mg by mouth 3 (three) times daily between meals as needed for spasms.   Yes Historical Provider, MD  gabapentin (NEURONTIN) 300 MG capsule Take 300 mg by mouth 3 (three) times daily.   Yes Historical Provider, MD  hydrOXYzine (ATARAX/VISTARIL) 25 MG tablet Take 25 mg by mouth 3 (three) times daily as needed.   Yes Historical Provider, MD  lisinopril-hydrochlorothiazide (PRINZIDE,ZESTORETIC) 20-25 MG tablet Take 1 tablet by mouth daily.   Yes Historical Provider, MD  meloxicam (MOBIC) 7.5 MG tablet Take 7.5 mg by mouth daily.   Yes  Historical Provider, MD  ondansetron (ZOFRAN ODT) 8 MG disintegrating tablet Take 1 tablet (8 mg total) by mouth every 8 (eight) hours as needed for nausea or vomiting. 10/05/14  Yes Sharman Cheek, MD  pantoprazole (PROTONIX) 40 MG tablet Take 40 mg by mouth daily.   Yes Historical Provider, MD  QUEtiapine (SEROQUEL) 100 MG tablet Take 100 mg by mouth at bedtime.   Yes Historical Provider, MD  nitrofurantoin (MACRODANTIN) 100 MG capsule Take 1 capsule (100 mg total) by mouth 2 (two) times daily. Patient not taking: Reported on 04/06/2015 10/05/14   Sharman Cheek, MD  ranitidine (ZANTAC) 150 MG capsule Take 1 capsule (150 mg total) by mouth 2 (two) times daily. Patient not taking: Reported on 04/06/2015 10/05/14   Sharman Cheek, MD  sucralfate (CARAFATE) 1 g tablet Take 1 g by mouth 4 (four) times daily. Reported on 04/06/2015    Historical Provider, MD    Allergies as of 04/01/2015  . (No Known Allergies)    Family History  Problem Relation Age of Onset  . Hypertension Mother   . Cancer Maternal Grandfather     pancreas    Social History   Social History  . Marital Status: Married    Spouse Name: N/A  . Number of Children: N/A  . Years of Education: N/A   Occupational History  .  Not on file.   Social History Main Topics  . Smoking status: Never Smoker   . Smokeless tobacco: Never Used  . Alcohol Use: No  . Drug Use: No  . Sexual Activity: Yes    Birth Control/ Protection: Surgical   Other Topics Concern  . Not on file   Social History Narrative     Physical Exam: BP 137/100 mmHg  Pulse 102  Temp(Src) 99.3 F (37.4 C) (Tympanic)  Resp 17  Ht  (1.626 m)  Wt 108.863 kg (240 lb)  BMI 41.18 kg/m2  SpO2 99%  LMP 02/27/2015 General:   Alert,  pleasant and cooperative in NAD Head:  Normocephalic and atraumatic. Neck:  Supple; no masses or thyromegaly. Lungs:  Clear throughout to auscultation.    Heart:  Regular rate and rhythm. Abdomen:  Soft, nontender  and nondistended. Normal bowel sounds, without guarding, and without rebound.   Neurologic:  Alert and  oriented x4;  grossly normal neurologically.  Impression/Plan: [name] is here for an endoscopy to be performed for epigastric pain  Risks, benefits, limitations, and alternatives regarding  endoscopy have been reviewed with the patient.  Questions have been answered.  All parties agreeable.   Elnita Maxwell, MD  04/06/2015, 2:46 PM

## 2015-04-06 NOTE — Transfer of Care (Signed)
Immediate Anesthesia Transfer of Care Note  Patient: Andrea Harmon  Procedure(s) Performed: Procedure(s): ESOPHAGOGASTRODUODENOSCOPY (EGD) WITH PROPOFOL (N/A)  Patient Location: PACU  Anesthesia Type:General  Level of Consciousness: awake and alert   Airway & Oxygen Therapy: Patient Spontanous Breathing  Post-op Assessment: Report given to RN  Post vital signs: Reviewed and stable  Last Vitals:  Filed Vitals:   04/06/15 1413 04/06/15 1459  BP: 137/100 127/64  Pulse: 102 93  Temp: 37.4 C 36.4 C  Resp: 17 20    Complications: No apparent anesthesia complications

## 2015-04-06 NOTE — Op Note (Signed)
[facility] Gastroenterology Patient Name: Andrea Harmon Procedure Date: 04/06/2015 2:51 PM MRN: [id number] Account #: [id number] Date of Birth: 1981-02-08 Admit Type: Outpatient Age: 35 Room: [facility] ENDO ROOM 2 Gender: Female Note Status: Finalized Procedure:            Upper GI endoscopy Indications:          Epigastric abdominal pain Patient Profile:      This is a 35 year old female. Providers:            Rhona Raider. Shelle Iron, MD Referring MD:         Reyes Ivan. Lacie Scotts, MD (Referring MD) Medicines:            Propofol per Anesthesia Complications:        No immediate complications. Procedure:            Pre-Anesthesia Assessment:                       - Prior to the procedure, a History and Physical was                        performed, and patient medications, allergies and                        sensitivities were reviewed. The patient's tolerance of                        previous anesthesia was reviewed.                       After obtaining informed consent, the endoscope was                        passed under direct vision. Throughout the procedure,                        the patient's blood pressure, pulse, and oxygen                        saturations were monitored continuously. The Endoscope                        was introduced through the mouth, and advanced to the                        second part of duodenum. The upper GI endoscopy was                        accomplished without difficulty. The patient tolerated                        the procedure well. Findings:      The esophagus was normal.      Scattered mild inflammation characterized by erythema and granularity       was found in the gastric body. Biopsies were taken with a cold forceps       for histology.      The examined duodenum was normal. Impression:           - Normal esophagus.                       -  Gastritis. Biopsied.                       - Normal examined duodenum.                  - CT and EGD are re-assuring in terms of the epi pain.                        Likely functional dyspepsia. Recommendation:       - Observe patient in GI recovery unit.                       - Resume regular diet.                       - Continue present medications.                       - Await pathology results.                       - Return to GI clinic.                       - The findings and recommendations were discussed with                        the patient.                       - The findings and recommendations were discussed with                        the patient's family. Procedure Code(s):    --- Professional ---                       [phone], Esophagogastroduodenoscopy, flexible, transoral;                        with biopsy, single or multiple Diagnosis Code(s):    --- Professional ---                       K29.70, Gastritis, unspecified, without bleeding                       R10.13, Epigastric pain CPT copyright 2016 American [employer]. All rights reserved. The codes documented in this report are preliminary and upon coder review may  be revised to meet current compliance requirements. Kathalene Frames, MD 04/06/2015 3:03:42 PM This report has been signed electronically. Number of Addenda: 0 Note Initiated On: 04/06/2015 2:51 PM      [facility]

## 2015-04-07 ENCOUNTER — Encounter: Payer: Self-pay | Admitting: Gastroenterology

## 2015-04-09 LAB — SURGICAL PATHOLOGY

## 2015-11-25 IMAGING — CT CT ANGIO CHEST
2 of 6 series · 18 of 36 positions shown · IV contrast (APPLIED)
Comparison: Plain film earlier today.  No prior CT.

CLINICAL DATA: Anterior chest pain for 24 hr. Elevated D-dimer.
Shortness of breath.

EXAM:
CT ANGIOGRAPHY CHEST WITH CONTRAST
TECHNIQUE: Multidetector CT imaging of the chest was performed using the
standard protocol during bolus administration of intravenous
contrast. Multiplanar CT image reconstructions including MIPs were
obtained to evaluate the vascular anatomy.
CONTRAST:  100 cc Isovue 370

[Series 5: pe 1.0 thins · axial · 0.60mm/px · z∈[-362,-99]mm · 17 of 297 slices shown]
[im 17/297  lung]
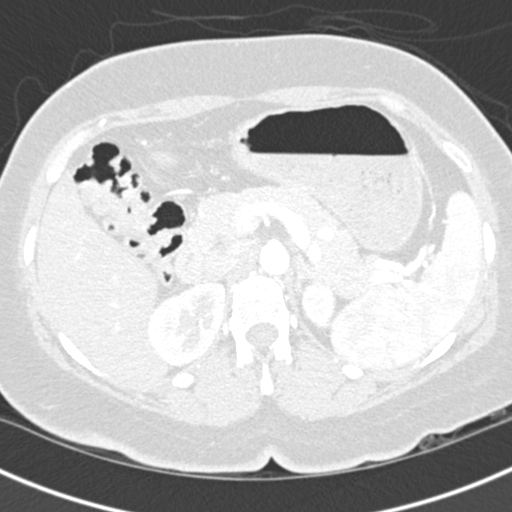
[im 33/297  mediastinal]
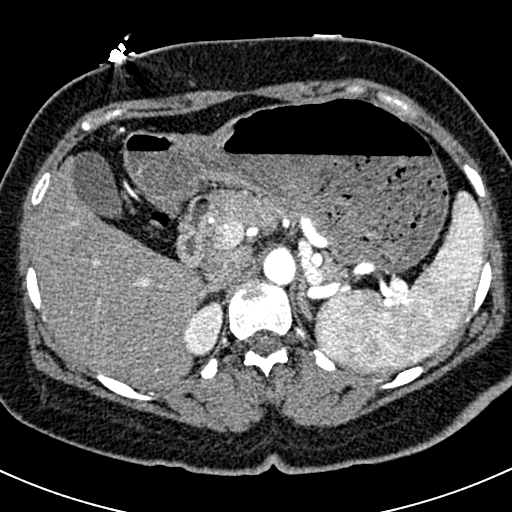
[im 50/297  lung]
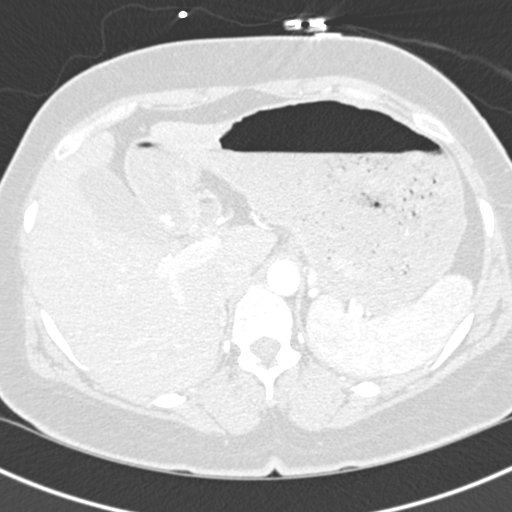
[im 66/297  mediastinal]
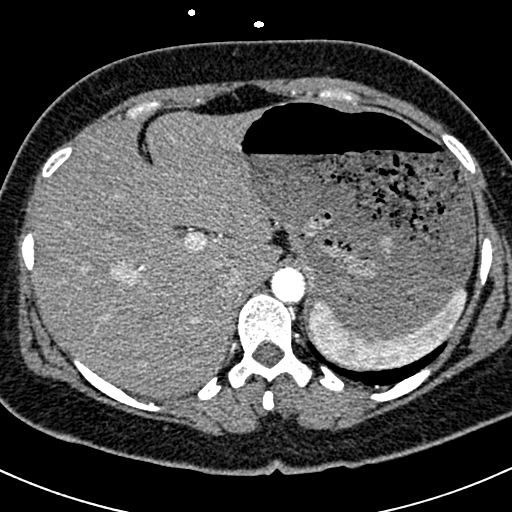
[im 83/297  lung]
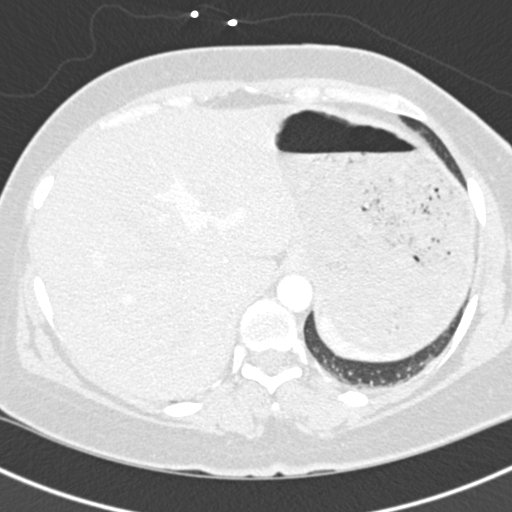
[im 99/297  mediastinal]
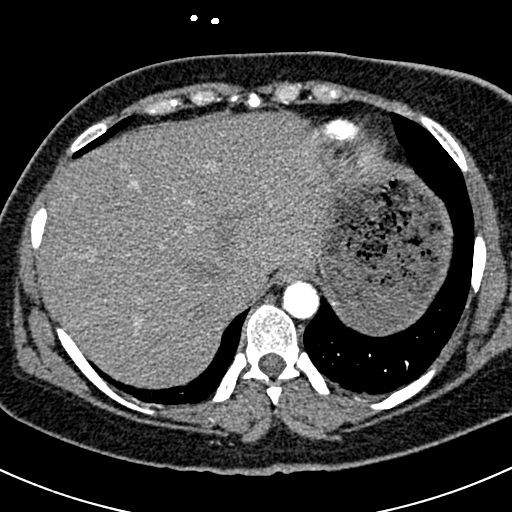
[im 116/297  lung]
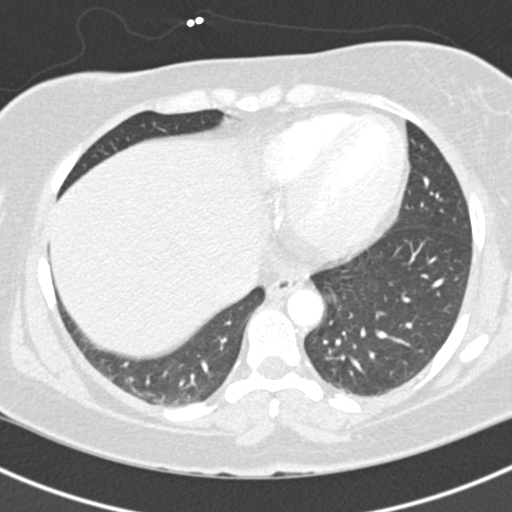
[im 132/297  mediastinal]
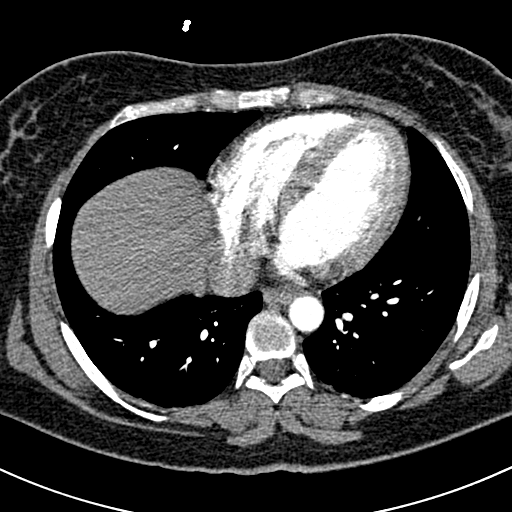
[im 149/297  lung]
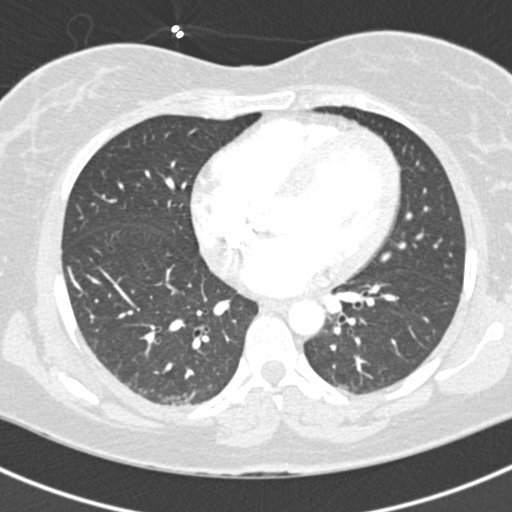
[im 165/297  mediastinal]
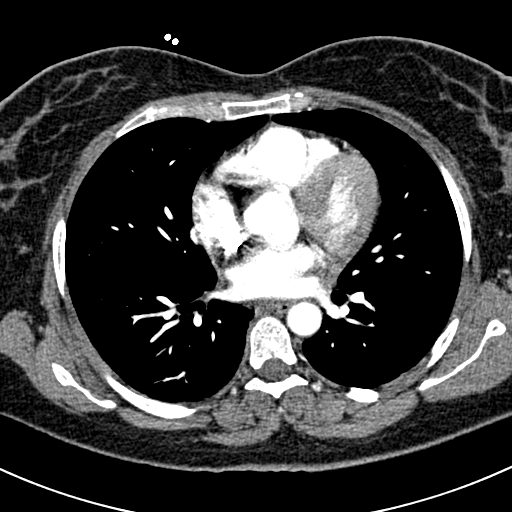
[im 181/297  lung]
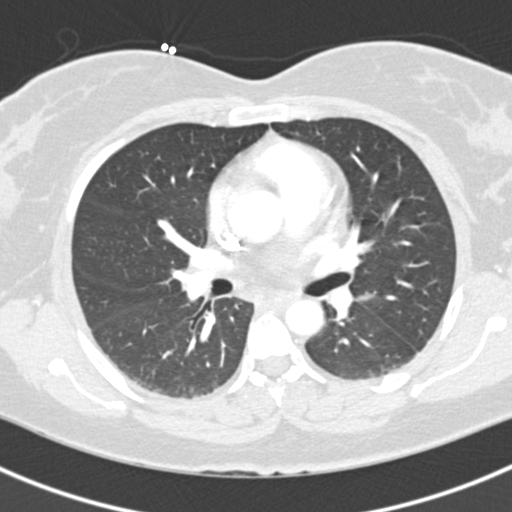
[im 198/297  mediastinal]
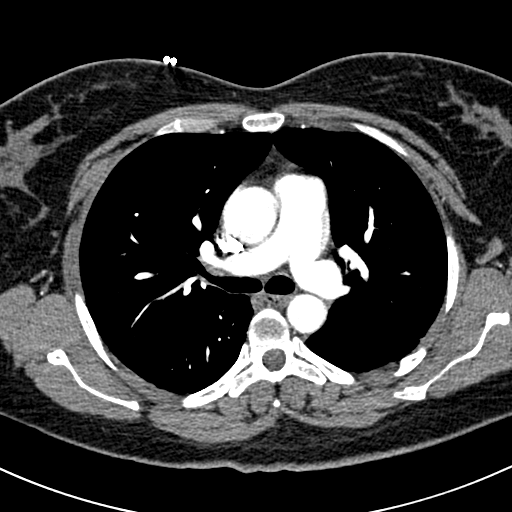
[im 214/297  lung]
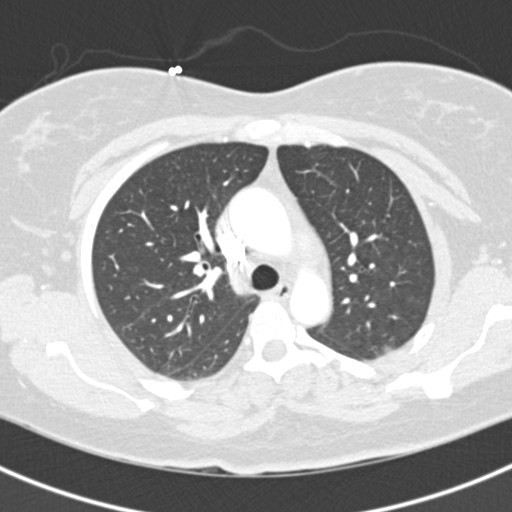
[im 231/297  mediastinal]
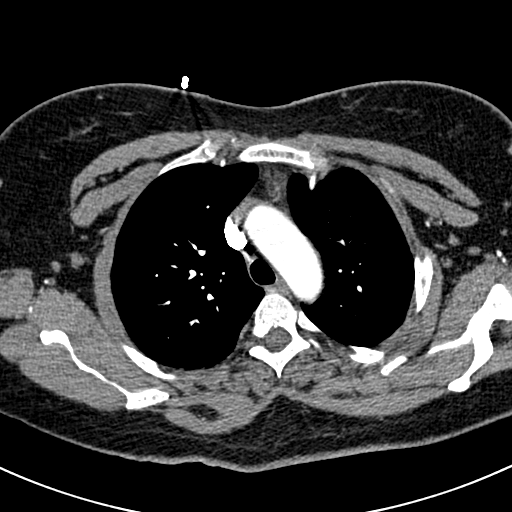
[im 247/297  lung]
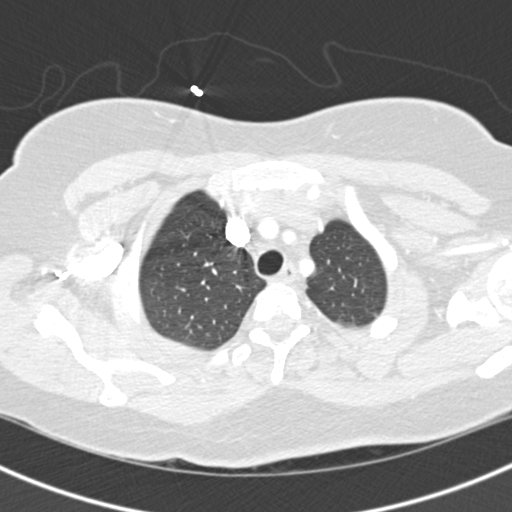
[im 264/297  mediastinal]
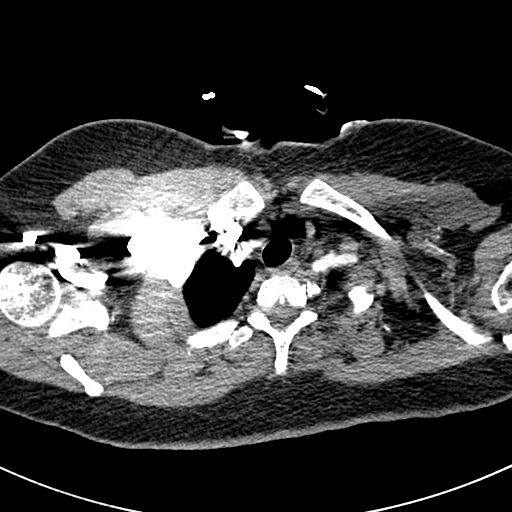
[im 280/297  lung]
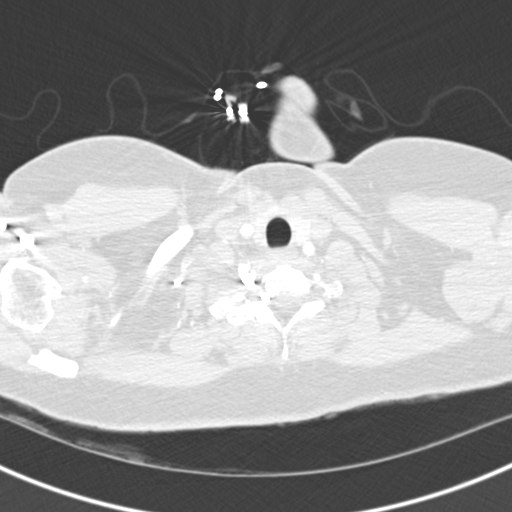

[Series 7: cor pe 2.0 mpr · coronal · 0.57mm/px · 1 of 115 slices shown]
[im 58/115  mediastinal]
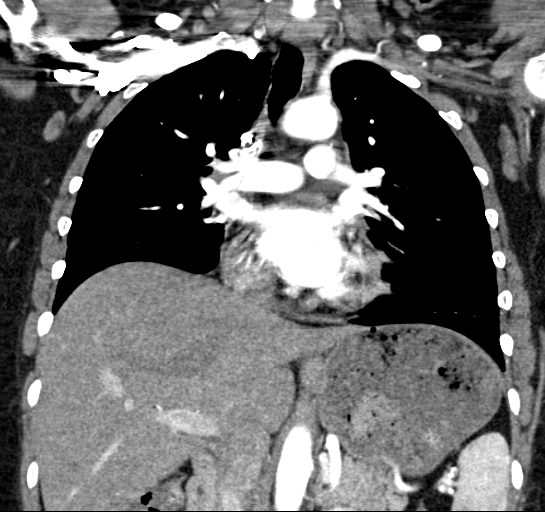

[18 of 36 positions shown; findings below may reference images not displayed]

FINDINGS: Lung windows demonstrate clear lungs.  ...

Soft tissue windows: The quality of this exam for evaluation of
pulmonary embolism is good. No evidence of pulmonary embolism. Heart
size upper normal, without pericardial or pleural effusion. No
mediastinal or hilar adenopathy. Minimal residual thymic tissue in
the anterior mediastinum. ...

Limited abdominal imaging demonstrates no significant findings.

Bone windows demonstrate no focal osseous lesion.

Review of the MIP images confirms the above findings.
IMPRESSION: No evidence of pulmonary embolism or other explanation for patient's
Chest pain.

## 2015-12-16 ENCOUNTER — Emergency Department
Admission: EM | Admit: 2015-12-16 | Discharge: 2015-12-16 | Disposition: A | Discharge status: Home / Self Care | Payer: Medicaid Other | Attending: Emergency Medicine | Admitting: Emergency Medicine

## 2015-12-16 ENCOUNTER — Emergency Department: Payer: Medicaid Other

## 2015-12-16 DIAGNOSIS — Z791 Long term (current) use of non-steroidal anti-inflammatories (NSAID): Secondary | ICD-10-CM | POA: Diagnosis not present

## 2015-12-16 DIAGNOSIS — R531 Weakness: Secondary | ICD-10-CM | POA: Diagnosis not present

## 2015-12-16 DIAGNOSIS — Z79899 Other long term (current) drug therapy: Secondary | ICD-10-CM | POA: Diagnosis not present

## 2015-12-16 DIAGNOSIS — R5383 Other fatigue: Secondary | ICD-10-CM | POA: Insufficient documentation

## 2015-12-16 DIAGNOSIS — I509 Heart failure, unspecified: Secondary | ICD-10-CM | POA: Diagnosis not present

## 2015-12-16 DIAGNOSIS — J45909 Unspecified asthma, uncomplicated: Secondary | ICD-10-CM | POA: Insufficient documentation

## 2015-12-16 DIAGNOSIS — I110 Hypertensive heart disease with heart failure: Secondary | ICD-10-CM | POA: Diagnosis not present

## 2015-12-16 LAB — CBC
HCT: 41.7 % (ref 35.0–47.0)
HEMOGLOBIN: 14.8 g/dL (ref 12.0–16.0)
MCH: 31.9 pg (ref 26.0–34.0)
MCHC: 35.4 g/dL (ref 32.0–36.0)
MCV: 90.1 fL (ref 80.0–100.0)
Platelets: 203 10*3/uL (ref 150–440)
RBC: 4.63 MIL/uL (ref 3.80–5.20)
RDW: 12.8 % (ref 11.5–14.5)
WBC: 11.1 10*3/uL — ABNORMAL HIGH (ref 3.6–11.0)

## 2015-12-16 LAB — URINALYSIS COMPLETE WITH MICROSCOPIC (ARMC ONLY)
Bacteria, UA: NONE SEEN
Bilirubin Urine: NEGATIVE
Glucose, UA: 50 mg/dL — AB
HGB URINE DIPSTICK: NEGATIVE
KETONES UR: NEGATIVE mg/dL
NITRITE: NEGATIVE
PROTEIN: NEGATIVE mg/dL
SPECIFIC GRAVITY, URINE: 1.015 (ref 1.005–1.030)
pH: 6 (ref 5.0–8.0)

## 2015-12-16 LAB — PREGNANCY, URINE: Preg Test, Ur: NEGATIVE

## 2015-12-16 LAB — BASIC METABOLIC PANEL
ANION GAP: 6 (ref 5–15)
BUN: 15 mg/dL (ref 6–20)
CALCIUM: 10.1 mg/dL (ref 8.9–10.3)
CO2: 22 mmol/L (ref 22–32)
Chloride: 109 mmol/L (ref 101–111)
Creatinine, Ser: 0.94 mg/dL (ref 0.44–1.00)
GFR calc non Af Amer: >60 mL/min (ref >60)
Glucose, Bld: 100 mg/dL — ABNORMAL HIGH (ref 65–99)
Potassium: 3.8 mmol/L (ref 3.5–5.1)
Sodium: 137 mmol/L (ref 135–145)

## 2015-12-16 LAB — GLUCOSE, CAPILLARY: Glucose-Capillary: 101 mg/dL — ABNORMAL HIGH (ref 65–99)

## 2015-12-16 MED ORDER — SODIUM CHLORIDE 0.9 % IV BOLUS (SEPSIS)
1000.0000 mL | Freq: Once | INTRAVENOUS | Status: AC
  Administered 2015-12-16: 1000 mL via INTRAVENOUS

## 2015-12-16 NOTE — ED Triage Notes (Signed)
Pt arrives with reports of fatigue over the last week  Pt states  "It happens in the mornings - I get really sleepy and I just have to lay down.  I wonder if it has something to do with my sugar."     Denies N/V/D  Reports a good appetite

## 2015-12-16 NOTE — ED Provider Notes (Signed)
[facility] Emergency Department Provider Note  ____________________________________________  Time seen: Approximately 9:51 AM  I have reviewed the triage vital signs and the nursing notes.   HISTORY  Chief Complaint Fatigue   HPI Andrea Harmon is a 35 y.o. 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 Patient reports for the last week she has been feeling extremely fatigued with generalized weakness. She reports she's been sleeping most of the day for the last 3 days. Today she was taking her kids to school and continued to feel the same so came to the emergency room for evaluation. She denies headache, neck stiffness, fever, chills, nausea, vomiting, congestion, cough, sore throat, chest pain, shortness of breath, abdominal pain, nausea, vomiting, diarrhea, dysuria. She reports normal appetite.  Past Medical History:  Diagnosis Date  . Ankle fracture, left   . Asthma    NO INHALER CURRETNLY  . Bipolar disorder (HCC)   . CHF (congestive heart failure) (HCC)   . Depression   . GERD (gastroesophageal reflux disease)   . Hypertension   . Migraine   . Nonalcoholic fatty liver disease   . Obesity   . Vitamin D deficiency     Patient Active Problem List   Diagnosis Date Noted  . Pelvic pain in female 04/25/2012  . Other and unspecified ovarian cyst 04/25/2012    Past Surgical History:  Procedure Laterality Date  . DILATION AND CURETTAGE OF UTERUS    . dnc    . ESOPHAGOGASTRODUODENOSCOPY (EGD) WITH PROPOFOL N/A 04/06/2015   Procedure: ESOPHAGOGASTRODUODENOSCOPY (EGD) WITH PROPOFOL;  Surgeon: Elnita MaxwellMatthew Gordon Rein, MD;  [facility] END[facility];  Service: Endoscopy;  Laterality: N/A;  . LAPAROSCOPY Right 09/30/2014   Procedure: LAPAROSCOPY OPERATIVE;  Surgeon: Nadara Mustardobert P Harris, MD;  Location: [facility] ORS;  Service: Gynecology;  Laterality: Right;  . MOUTH  SURGERY    . OVARIAN CYST REMOVAL Right 09/30/2014   Procedure: OVARIAN CYSTECTOMY;  Surgeon: Nadara Mustardobert P Harris, MD;  Location: [facility] ORS;  Service: Gynecology;  Laterality: Right;  . TUBAL LIGATION      Prior to Admission medications   Medication Sig Start Date End Date Taking? Authorizing Provider  amLODipine (NORVASC) 5 MG tablet Take 1 tablet (5 mg total) by mouth daily. Patient taking differently: Take 5 mg by mouth every morning.  12/25/13   Geoffery Lyonsouglas Delo, MD  buPROPion (WELLBUTRIN SR) 150 MG 12 hr tablet Take 150 mg by mouth daily.    Historical Provider, MD  dicyclomine (BENTYL) 10 MG capsule Take 10 mg by mouth 3 (three) times daily between meals as needed for spasms.    Historical Provider, MD  gabapentin (NEURONTIN) 300 MG capsule Take 300 mg by mouth 3 (three) times daily.    Historical Provider, MD  hydrOXYzine (ATARAX/VISTARIL) 25 MG tablet Take 25 mg by mouth 3 (three) times daily as needed.    Historical Provider, MD  lisinopril-hydrochlorothiazide (PRINZIDE,ZESTORETIC) 20-25 MG tablet Take 1 tablet by mouth daily.    Historical Provider, MD  meloxicam (MOBIC) 7.5 MG tablet Take 7.5 mg by mouth daily.    Historical Provider, MD  nitrofurantoin (MACRODANTIN) 100 MG capsule Take 1 capsule (100 mg total) by mouth 2 (two) times daily. Patient not taking: Reported on 04/06/2015 10/05/14   Sharman CheekPhillip Stafford, MD  ondansetron [facility](ZOFRAN ODT) 8 MG disintegrating tablet Take 1 tablet (8 mg total) by mouth every 8 (eight) hours as needed for nausea or vomiting.  10/05/14   Sharman Cheek, MD  pantoprazole (PROTONIX) 40 MG tablet Take 40 mg by mouth daily.    Historical Provider, MD  QUEtiapine (SEROQUEL) 100 MG tablet Take 100 mg by mouth at bedtime.    Historical Provider, MD  ranitidine (ZANTAC) 150 MG capsule Take 1 capsule (150 mg total) by mouth 2 (two) times daily. Patient not taking: Reported on 04/06/2015 10/05/14   Sharman Cheek, MD  sucralfate (CARAFATE) 1 g tablet Take 1 g by mouth 4  (four) times daily. Reported on 04/06/2015    Historical Provider, MD    Allergies Vicodin [hydrocodone-acetaminophen]  Family History  Problem Relation Age of Onset  . Hypertension Mother   . Cancer Maternal Grandfather     pancreas    Social History Social History  Substance Use Topics  . Smoking status: Never Smoker  . Smokeless tobacco: Never Used  . Alcohol use No    Review of Systems  Constitutional: Negative for fever. + generalized weakness and fatigue Eyes: Negative for visual changes. ENT: Negative for sore throat. Cardiovascular: Negative for chest pain. Respiratory: Negative for shortness of breath. Gastrointestinal: Negative for abdominal pain, vomiting or diarrhea. Genitourinary: Negative for dysuria. Musculoskeletal: Negative for back pain. Skin: Negative for rash. Neurological: Negative for headaches, weakness or numbness.  ____________________________________________   PHYSICAL EXAM:  VITAL SIGNS: ED Triage Vitals  Enc Vitals Group     BP 12/16/15 0911 113/85     Pulse Rate 12/16/15 0911 (!) 58     Resp 12/16/15 0911 18     Temp 12/16/15 0911 98.1 F (36.7 C)     Temp Source 12/16/15 0911 Oral     SpO2 12/16/15 0911 98 %     Weight 12/16/15 0911 250 lb (113.4 kg)     Height 12/16/15 0911 5\' 2"  (1.575 m)     Head Circumference --      Peak Flow --      Pain Score 12/16/15 0912 0     Pain Loc --      Pain Edu? --      Excl. in GC? --     Constitutional: Alert and oriented. Well appearing and in no apparent distress. HEENT:      Head: Normocephalic and atraumatic.         Eyes: Conjunctivae are normal. Sclera is non-icteric. EOMI. PERRL      Mouth/Throat: Mucous membranes are moist.       Neck: Supple with no signs of meningismus. Cardiovascular: Regular rate and rhythm. No murmurs, gallops, or rubs. 2+ symmetrical distal pulses are present in all extremities. No JVD. Respiratory: Normal respiratory effort. Lungs are clear to auscultation  bilaterally. No wheezes, crackles, or rhonchi.  Gastrointestinal: Soft, non tender, and non distended with positive bowel sounds. No rebound or guarding. Genitourinary: No CVA tenderness. Musculoskeletal: Nontender with normal range of motion in all extremities. No edema, cyanosis, or erythema of extremities. Neurologic: Normal speech and language. Face is symmetric. Moving all extremities. No gross focal neurologic deficits are appreciated. Skin: Skin is warm, dry and intact. No rash noted. Psychiatric: Mood and affect are normal. Speech and behavior are normal.  ____________________________________________   LABS (all labs ordered are listed, but only abnormal results are displayed)  Labs Reviewed  BASIC METABOLIC PANEL - Abnormal; Notable for the following:       Result Value   Glucose, Bld 100 (*)    All other components within normal limits  CBC - Abnormal; Notable for the  following:    WBC 11.1 (*)    All other components within normal limits  URINALYSIS COMPLETEWITH MICROSCOPIC ([facility] ONLY) - Abnormal; Notable for the following:    Color, Urine YELLOW (*)    APPearance HAZY (*)    Glucose, UA 50 (*)    Leukocytes, UA 2+ (*)    Squamous Epithelial / LPF 6-30 (*)    All other components within normal limits  GLUCOSE, CAPILLARY - Abnormal; Notable for the following:    Glucose-Capillary 101 (*)    All other components within normal limits  PREGNANCY, URINE  CBG MONITORING, ED   ____________________________________________  EKG  none  ____________________________________________  RADIOLOGY  CXR: Negative ____________________________________________   PROCEDURES  Procedure(s) performed: None Procedures Critical Care performed:  None ____________________________________________   INITIAL IMPRESSION / ASSESSMENT AND PLAN / ED COURSE  35 y.o. 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 Patient with no other associated symptoms, no signs of symptoms of infection. Vital signs are within normal limits. Physical exam with no acute findings. We'll check electrolytes, chest x-ray, urinalysis, urine pregnancy. We'll give IV fluids. Differential diagnoses including possibly early viral infection versus side effect of Prolastin.  Clinical Course  Comment By Time  Blood work, urine, and CXR with no acute findings. Patient will be dc home with close f/u with PCP and supportive care. Nita Sicklearolina Mija Effertz, MD 11/01 1037    Pertinent labs & imaging results that were available during my care of the patient were reviewed by me and considered in my medical decision making (see chart for details).    ____________________________________________   FINAL CLINICAL IMPRESSION(S) / ED DIAGNOSES  Final diagnoses:  Generalized weakness      NEW MEDICATIONS STARTED DURING THIS VISIT:  New Prescriptions   No medications on file     Note:  This document was prepared using Dragon voice recognition software and may include unintentional dictation errors.    Nita Sicklearolina Neeti Knudtson, MD 12/16/15 1039

## 2015-12-17 LAB — URINE CULTURE

## 2016-06-22 ENCOUNTER — Emergency Department: Payer: Medicaid Other

## 2016-06-22 ENCOUNTER — Encounter: Payer: Self-pay | Admitting: *Deleted

## 2016-06-22 ENCOUNTER — Emergency Department
Admission: EM | Admit: 2016-06-22 | Discharge: 2016-06-22 | Disposition: A | Discharge status: Home / Self Care | Payer: Medicaid Other | Attending: Emergency Medicine | Admitting: Emergency Medicine

## 2016-06-22 DIAGNOSIS — Z79899 Other long term (current) drug therapy: Secondary | ICD-10-CM | POA: Diagnosis not present

## 2016-06-22 DIAGNOSIS — I110 Hypertensive heart disease with heart failure: Secondary | ICD-10-CM | POA: Insufficient documentation

## 2016-06-22 DIAGNOSIS — G40909 Epilepsy, unspecified, not intractable, without status epilepticus: Secondary | ICD-10-CM | POA: Insufficient documentation

## 2016-06-22 DIAGNOSIS — I509 Heart failure, unspecified: Secondary | ICD-10-CM | POA: Diagnosis not present

## 2016-06-22 DIAGNOSIS — G43809 Other migraine, not intractable, without status migrainosus: Secondary | ICD-10-CM

## 2016-06-22 DIAGNOSIS — R079 Chest pain, unspecified: Secondary | ICD-10-CM | POA: Diagnosis not present

## 2016-06-22 DIAGNOSIS — J45909 Unspecified asthma, uncomplicated: Secondary | ICD-10-CM | POA: Insufficient documentation

## 2016-06-22 LAB — URINALYSIS, ROUTINE W REFLEX MICROSCOPIC
BACTERIA UA: NONE SEEN
BILIRUBIN URINE: NEGATIVE
GLUCOSE, UA: 50 mg/dL — AB
HGB URINE DIPSTICK: NEGATIVE
KETONES UR: NEGATIVE mg/dL
NITRITE: NEGATIVE
PROTEIN: NEGATIVE mg/dL
Specific Gravity, Urine: 1.017 (ref 1.005–1.030)
pH: 6 (ref 5.0–8.0)

## 2016-06-22 LAB — CBC
HCT: 43 % (ref 35.0–47.0)
HEMOGLOBIN: 14.9 g/dL (ref 12.0–16.0)
MCH: 31.4 pg (ref 26.0–34.0)
MCHC: 34.8 g/dL (ref 32.0–36.0)
MCV: 90.3 fL (ref 80.0–100.0)
Platelets: 270 10*3/uL (ref 150–440)
RBC: 4.76 MIL/uL (ref 3.80–5.20)
RDW: 12.6 % (ref 11.5–14.5)
WBC: 12.6 10*3/uL — ABNORMAL HIGH (ref 3.6–11.0)

## 2016-06-22 LAB — BASIC METABOLIC PANEL
ANION GAP: 7 (ref 5–15)
BUN: 14 mg/dL (ref 6–20)
CHLORIDE: 102 mmol/L (ref 101–111)
CO2: 28 mmol/L (ref 22–32)
Calcium: 10.5 mg/dL — ABNORMAL HIGH (ref 8.9–10.3)
Creatinine, Ser: 1.08 mg/dL — ABNORMAL HIGH (ref 0.44–1.00)
GFR calc non Af Amer: >60 mL/min (ref >60)
GLUCOSE: 117 mg/dL — AB (ref 65–99)
Potassium: 3.7 mmol/L (ref 3.5–5.1)
Sodium: 137 mmol/L (ref 135–145)

## 2016-06-22 LAB — TROPONIN I: Troponin I: <0.03 ng/mL (ref <0.03)

## 2016-06-22 LAB — POC URINE PREG, ED: Preg Test, Ur: NEGATIVE

## 2016-06-22 MED ORDER — SODIUM CHLORIDE 0.9 % IV BOLUS (SEPSIS)
1000.0000 mL | Freq: Once | INTRAVENOUS | Status: AC
  Administered 2016-06-22: 1000 mL via INTRAVENOUS

## 2016-06-22 MED ORDER — IOPAMIDOL (ISOVUE-370) INJECTION 76%
100.0000 mL | Freq: Once | INTRAVENOUS | Status: AC | PRN
Start: 2016-06-22 — End: 2016-06-22
  Administered 2016-06-22: 100 mL via INTRAVENOUS

## 2016-06-22 MED ORDER — DIPHENHYDRAMINE HCL 50 MG/ML IJ SOLN
50.0000 mg | Freq: Once | INTRAMUSCULAR | Status: AC
  Administered 2016-06-22: 50 mg via INTRAVENOUS
  Filled 2016-06-22: qty 1

## 2016-06-22 MED ORDER — BUTALBITAL-APAP-CAFFEINE 50-325-40 MG PO TABS: 1.0000 | ORAL_TABLET | Freq: Four times a day (QID) | ORAL | 0 refills | Status: AC | PRN

## 2016-06-22 MED ORDER — METOCLOPRAMIDE HCL 5 MG/ML IJ SOLN
10.0000 mg | Freq: Once | INTRAMUSCULAR | Status: AC
Start: 2016-06-22 — End: 2016-06-22
  Administered 2016-06-22: 10 mg via INTRAVENOUS
  Filled 2016-06-22: qty 2

## 2016-06-22 MED ORDER — KETOROLAC TROMETHAMINE 30 MG/ML IJ SOLN
30.0000 mg | Freq: Once | INTRAMUSCULAR | Status: AC
  Administered 2016-06-22: 30 mg via INTRAVENOUS
  Filled 2016-06-22: qty 1

## 2016-06-22 NOTE — ED Provider Notes (Signed)
[facility] Emergency Department Provider Note  Time seen: 6:22 PM  I have reviewed the triage vital signs and the nursing notes.   HISTORY  Chief Complaint Chest Pain and Back Pain    HPI Andrea Harmon is a 36 y.o. female with a past medical history of asthma, bipolar, CHF, gastric reflux, hypertension, migraines, presents to the emergency department for a headache, left-sided chest pain. According to the patient since this morning she has had generalized weakness sensation, she states 2 days of a headache which feels like a migraine per patient, associated with photophobia, phonophobia. Patient has a history of migraines. She states around 12 PM today she had acute onset of left-sided chest pain which she describes as sharp. Patient states the pain is worse with deep inspiration. Patient is mildly tachycardic upon arrival, afebrile, denies any recent fever, leg pain or swelling. Denies estrogen use.  Past Medical History:  Diagnosis Date  . Ankle fracture, left   . Asthma    NO INHALER CURRETNLY  . Bipolar disorder (HCC)   . CHF (congestive heart failure) (HCC)   . Depression   . GERD (gastroesophageal reflux disease)   . Hypertension   . Migraine   . Nonalcoholic fatty liver disease   . Obesity   . Vitamin D deficiency     Patient Active Problem List   Diagnosis Date Noted  . Pelvic pain in female 04/25/2012  . Other and unspecified ovarian cyst 04/25/2012    Past Surgical History:  Procedure Laterality Date  . DILATION AND CURETTAGE OF UTERUS    . dnc    . ESOPHAGOGASTRODUODENOSCOPY (EGD) WITH PROPOFOL N/A 04/06/2015   Procedure: ESOPHAGOGASTRODUODENOSCOPY (EGD) WITH PROPOFOL;  Surgeon: Elnita Maxwell, MD;  Location: [facility] ENDOSCOPY;  Service: Endoscopy;  Laterality: N/A;  . LAPAROSCOPY Right 09/30/2014   Procedure: LAPAROSCOPY OPERATIVE;  Surgeon: Nadara Mustard, MD;  Location: [facility] ORS;  Service: Gynecology;  Laterality: Right;  .  MOUTH SURGERY    . OVARIAN CYST REMOVAL Right 09/30/2014   Procedure: OVARIAN CYSTECTOMY;  Surgeon: Nadara Mustard, MD;  Location: [facility] ORS;  Service: Gynecology;  Laterality: Right;  . TUBAL LIGATION      Prior to Admission medications   Medication Sig Start Date End Date Taking? Authorizing Provider  amLODipine (NORVASC) 5 MG tablet Take 1 tablet (5 mg total) by mouth daily. Patient taking differently: Take 5 mg by mouth every morning.  12/25/13   Geoffery Lyons, MD  buPROPion (WELLBUTRIN SR) 150 MG 12 hr tablet Take 150 mg by mouth daily.    [provider]  dicyclomine (BENTYL) 10 MG capsule Take 10 mg by mouth 3 (three) times daily between meals as needed for spasms.    [provider]  gabapentin (NEURONTIN) 300 MG capsule Take 300 mg by mouth 3 (three) times daily.    [provider]  hydrOXYzine (ATARAX/VISTARIL) 25 MG tablet Take 25 mg by mouth 3 (three) times daily as needed.    [provider]  lisinopril-hydrochlorothiazide (PRINZIDE,ZESTORETIC) 20-25 MG tablet Take 1 tablet by mouth daily.    [provider]  meloxicam (MOBIC) 7.5 MG tablet Take 7.5 mg by mouth daily.    [provider]  nitrofurantoin (MACRODANTIN) 100 MG capsule Take 1 capsule (100 mg total) by mouth 2 (two) times daily. Patient not taking: Reported on 04/06/2015 10/05/14   Sharman Cheek, MD  ondansetron (ZOFRAN ODT) 8 MG disintegrating tablet Take 1 tablet (8 mg total) by mouth  every 8 (eight) hours as needed for nausea or vomiting. 10/05/14   Sharman CheekStafford, Phillip, MD  pantoprazole (PROTONIX) 40 MG tablet Take 40 mg by mouth daily.    [provider]  QUEtiapine (SEROQUEL) 100 MG tablet Take 100 mg by mouth at bedtime.    [provider]  ranitidine (ZANTAC) 150 MG capsule Take 1 capsule (150 mg total) by mouth 2 (two) times daily. Patient not taking: Reported on 04/06/2015 10/05/14   Sharman CheekStafford, Phillip, MD  sucralfate (CARAFATE) 1 g tablet Take  1 g by mouth 4 (four) times daily. Reported on 04/06/2015    [provider]    Allergies  Allergen Reactions  . Vicodin [Hydrocodone-Acetaminophen]     Family History  Problem Relation Age of Onset  . Hypertension Mother   . Cancer Maternal Grandfather     pancreas    Social History Social History  Substance Use Topics  . Smoking status: Never Smoker  . Smokeless tobacco: Never Used  . Alcohol use No    Review of Systems Constitutional: Negative for fever. Eyes: Positive for photophobia ENT: Negative for congestion, Or recent illness. Cardiovascular: Sharp left chest pain, worse with inspiration. Respiratory: Negative for shortness of breath. Mild cough. Gastrointestinal: Negative for abdominal pain, vomiting Genitourinary: Negative for dysuria. Musculoskeletal: Lower back pain which the patient states is chronic Skin: Negative for rash. Neurological: Moderate headache. Denies focal weakness or numbness but states generalized fatigue/weakness. All other ROS negative  ____________________________________________   PHYSICAL EXAM:  VITAL SIGNS: ED Triage Vitals  Enc Vitals Group     BP 06/22/16 1615 (!) 136/100     Pulse Rate 06/22/16 1615 (!) 103     Resp 06/22/16 1615 16     Temp 06/22/16 1615 98.5 F (36.9 C)     Temp Source 06/22/16 1615 Oral     SpO2 06/22/16 1615 96 %     Weight 06/22/16 1615 250 lb (113.4 kg)     Height 06/22/16 1615 5\' 3"  (1.6 m)     Head Circumference --      Peak Flow --      Pain Score 06/22/16 1624 9     Pain Loc --      Pain Edu? --      Excl. in GC? --     Constitutional: Alert and oriented. Well appearing and in no distress. Eyes: Normal exam ENT   Head: Normocephalic and atraumatic   Mouth/Throat: Mucous membranes are moist. Cardiovascular: Normal rate, regular rhythmAround 100 bpm. No audible murmur. Respiratory: Normal respiratory effort without tachypnea nor retractions. Breath sounds are  clear Gastrointestinal: Soft and nontender. No distention.   Musculoskeletal: Nontender with normal range of motion in all extremities. No lower extremity edema or tenderness. Neurologic:  Normal speech and language. No gross focal neurologic deficits  Skin:  Skin is warm, dry and intact.  Psychiatric: Mood and affect are normal.  ____________________________________________    EKG  EKG reviewed and interpreted by myself shows normal sinus rhythm at 96 bpm, narrow QRS, normal axis, normal intervals, nonspecific ST changes. No ST elevation.  ____________________________________________    RADIOLOGY  Chest x-ray negative CT is negative for PE.  ____________________________________________   INITIAL IMPRESSION / ASSESSMENT AND PLAN / ED COURSE  Pertinent labs & imaging results that were available during my care of the patient were reviewed by me and considered in my medical decision making (see chart for details).  Patient presents to the emergency department with a headache ongoing  for the past 2 days consistent with her history of migraines. Patient also states a sudden sharp left-sided chest pain that has become pleuritic. Patient is mildly tachycardic Route 105 bpm on arrival. Denies any shortness of breath but states the pain is worse with deep inspiration. No leg pain or swelling. We'll obtain a CT angiography of the chest to rule out pulmonary emboli. We'll treat the patient with Toradol, Reglan, Benadryl for her migraine.  CT angiography is negative for PE. Patient is feeling better after medications. We'll discharge home.  ____________________________________________   FINAL CLINICAL IMPRESSION(S) / ED DIAGNOSES  Left sided chest pain Migraine    Minna Antis, MD 06/22/16 1944

## 2016-06-22 NOTE — ED Notes (Signed)
Patient transported to CT 

## 2016-06-22 NOTE — ED Triage Notes (Signed)
Pt to triage via wheelchair.  Pt has chest pain and back pain.  Pt reports sx starting this morning.  Pt also reports sob.  Pain in left chest area.  Nonsmoker.  Pt alert.  Speech clear.

## 2016-12-03 ENCOUNTER — Emergency Department
Admission: EM | Admit: 2016-12-03 | Discharge: 2016-12-03 | Disposition: A | Discharge status: Home / Self Care | Payer: Medicaid Other | Attending: Emergency Medicine | Admitting: Emergency Medicine

## 2016-12-03 ENCOUNTER — Emergency Department: Payer: Medicaid Other

## 2016-12-03 ENCOUNTER — Encounter: Payer: Self-pay | Admitting: Emergency Medicine

## 2016-12-03 DIAGNOSIS — R0789 Other chest pain: Secondary | ICD-10-CM | POA: Diagnosis not present

## 2016-12-03 DIAGNOSIS — F319 Bipolar disorder, unspecified: Secondary | ICD-10-CM | POA: Insufficient documentation

## 2016-12-03 DIAGNOSIS — J45909 Unspecified asthma, uncomplicated: Secondary | ICD-10-CM | POA: Insufficient documentation

## 2016-12-03 DIAGNOSIS — I10 Essential (primary) hypertension: Secondary | ICD-10-CM | POA: Diagnosis not present

## 2016-12-03 DIAGNOSIS — Z79899 Other long term (current) drug therapy: Secondary | ICD-10-CM | POA: Diagnosis not present

## 2016-12-03 DIAGNOSIS — R079 Chest pain, unspecified: Secondary | ICD-10-CM | POA: Diagnosis present

## 2016-12-03 DIAGNOSIS — N390 Urinary tract infection, site not specified: Secondary | ICD-10-CM | POA: Insufficient documentation

## 2016-12-03 LAB — COMPREHENSIVE METABOLIC PANEL
ALK PHOS: 66 U/L (ref 38–126)
ALT: 107 U/L — AB (ref 14–54)
AST: 85 U/L — ABNORMAL HIGH (ref 15–41)
Albumin: 3.9 g/dL (ref 3.5–5.0)
Anion gap: 6 (ref 5–15)
BUN: 11 mg/dL (ref 6–20)
CALCIUM: 10.2 mg/dL (ref 8.9–10.3)
CO2: 24 mmol/L (ref 22–32)
CREATININE: 0.97 mg/dL (ref 0.44–1.00)
Chloride: 106 mmol/L (ref 101–111)
GFR calc Af Amer: >60 mL/min (ref >60)
Glucose, Bld: 97 mg/dL (ref 65–99)
Potassium: 3.4 mmol/L — ABNORMAL LOW (ref 3.5–5.1)
Sodium: 136 mmol/L (ref 135–145)
Total Bilirubin: 0.9 mg/dL (ref 0.3–1.2)
Total Protein: 7.3 g/dL (ref 6.5–8.1)

## 2016-12-03 LAB — URINALYSIS, COMPLETE (UACMP) WITH MICROSCOPIC
Bacteria, UA: NONE SEEN
Bilirubin Urine: NEGATIVE
GLUCOSE, UA: 50 mg/dL — AB
Ketones, ur: 5 mg/dL — AB
Nitrite: NEGATIVE
PH: 6 (ref 5.0–8.0)
PROTEIN: 30 mg/dL — AB
Specific Gravity, Urine: 1.014 (ref 1.005–1.030)

## 2016-12-03 LAB — CBC WITH DIFFERENTIAL/PLATELET
BASOS PCT: 1 %
Basophils Absolute: 0.1 10*3/uL (ref 0–0.1)
Eosinophils Absolute: 0.3 10*3/uL (ref 0–0.7)
Eosinophils Relative: 4 %
HCT: 41.4 % (ref 35.0–47.0)
HEMOGLOBIN: 14.7 g/dL (ref 12.0–16.0)
LYMPHS ABS: 2.5 10*3/uL (ref 1.0–3.6)
Lymphocytes Relative: 36 %
MCH: 31.8 pg (ref 26.0–34.0)
MCHC: 35.5 g/dL (ref 32.0–36.0)
MCV: 89.6 fL (ref 80.0–100.0)
MONOS PCT: 5 %
Monocytes Absolute: 0.4 10*3/uL (ref 0.2–0.9)
NEUTROS ABS: 3.8 10*3/uL (ref 1.4–6.5)
NEUTROS PCT: 54 %
PLATELETS: 250 10*3/uL (ref 150–440)
RBC: 4.62 MIL/uL (ref 3.80–5.20)
RDW: 12.6 % (ref 11.5–14.5)
WBC: 7 10*3/uL (ref 3.6–11.0)

## 2016-12-03 LAB — POCT PREGNANCY, URINE: Preg Test, Ur: NEGATIVE

## 2016-12-03 LAB — TROPONIN I

## 2016-12-03 LAB — FIBRIN DERIVATIVES D-DIMER (ARMC ONLY): Fibrin derivatives D-dimer (ARMC): 637.9 ng/mL (FEU) — ABNORMAL HIGH (ref 0.00–499.00)

## 2016-12-03 MED ORDER — CEPHALEXIN 500 MG PO CAPS: 500.0000 mg | ORAL_CAPSULE | Freq: Two times a day (BID) | ORAL | 0 refills | Status: AC

## 2016-12-03 MED ORDER — FAMOTIDINE IN NACL 20-0.9 MG/50ML-% IV SOLN
20.0000 mg | Freq: Once | INTRAVENOUS | Status: AC
  Administered 2016-12-03: 20 mg via INTRAVENOUS
  Filled 2016-12-03: qty 50

## 2016-12-03 MED ORDER — IOPAMIDOL (ISOVUE-370) INJECTION 76%
75.0000 mL | Freq: Once | INTRAVENOUS | Status: AC | PRN
  Administered 2016-12-03: 75 mL via INTRAVENOUS

## 2016-12-03 MED ORDER — GI COCKTAIL ~~LOC~~
30.0000 mL | Freq: Once | ORAL | Status: AC
  Administered 2016-12-03: 30 mL via ORAL
  Filled 2016-12-03: qty 30

## 2016-12-03 MED ORDER — KETOROLAC TROMETHAMINE 30 MG/ML IJ SOLN
30.0000 mg | Freq: Once | INTRAMUSCULAR | Status: AC
  Administered 2016-12-03: 30 mg via INTRAVENOUS
  Filled 2016-12-03: qty 1

## 2016-12-03 MED ORDER — MORPHINE SULFATE (PF) 2 MG/ML IV SOLN
2.0000 mg | Freq: Once | INTRAVENOUS | Status: AC
  Administered 2016-12-03: 2 mg via INTRAVENOUS
  Filled 2016-12-03: qty 1

## 2016-12-03 MED ORDER — CLONIDINE HCL 0.1 MG PO TABS
0.1000 mg | ORAL_TABLET | Freq: Once | ORAL | Status: AC
  Administered 2016-12-03: 0.1 mg via ORAL
  Filled 2016-12-03: qty 1

## 2016-12-03 NOTE — ED Provider Notes (Addendum)
[facility] Emergency Department Provider Note ____________________________________________   First MD Initiated Contact with Patient 12/03/16 1307     (approximate)  I have reviewed the triage vital signs and the nursing notes.   HISTORY  Chief Complaint Chest Pain    HPI Andrea Harmon is a 36 y.o. female Past medical history as below who presents with chest pain, gradual onset, duration for 3 days, mainly substernal and radiating into the left shoulder and arm, constant but intermittently more severe, associated with shortness of breath, as well as with intermittent numbness in the "whole left side of my body."  She states she has had both symptoms multiple times in the past. She denies any leg pain or swelling, or any cough or fever.   Past Medical History:  Diagnosis Date  . Ankle fracture, left   . Asthma    NO INHALER CURRETNLY  . Bipolar disorder (HCC)   . CHF (congestive heart failure) (HCC)   . Depression   . GERD (gastroesophageal reflux disease)   . Hypertension   . Migraine   . Nonalcoholic fatty liver disease   . Obesity   . Vitamin D deficiency     Patient Active Problem List   Diagnosis Date Noted  . Pelvic pain in female 04/25/2012  . Other and unspecified ovarian cyst 04/25/2012    Past Surgical History:  Procedure Laterality Date  . DILATION AND CURETTAGE OF UTERUS    . dnc    . ESOPHAGOGASTRODUODENOSCOPY (EGD) WITH PROPOFOL N/A 04/06/2015   Procedure: ESOPHAGOGASTRODUODENOSCOPY (EGD) WITH PROPOFOL;  Surgeon: Elnita Maxwell, MD;  Location: [facility] ENDOSCOPY;  Service: Endoscopy;  Laterality: N/A;  . LAPAROSCOPY Right 09/30/2014   Procedure: LAPAROSCOPY OPERATIVE;  Surgeon: Nadara Mustard, MD;  Location: [facility] ORS;  Service: Gynecology;  Laterality: Right;  . MOUTH SURGERY    . OVARIAN CYST REMOVAL Right 09/30/2014   Procedure: OVARIAN CYSTECTOMY;  Surgeon: Nadara Mustard, MD;  Location: [facility] ORS;  Service: Gynecology;   Laterality: Right;  . TUBAL LIGATION      Prior to Admission medications   Medication Sig Start Date End Date Taking? Authorizing Provider  amLODipine (NORVASC) 5 MG tablet Take 1 tablet (5 mg total) by mouth daily. Patient taking differently: Take 5 mg by mouth every morning.  12/25/13   Geoffery Lyons, MD  buPROPion (WELLBUTRIN SR) 150 MG 12 hr tablet Take 150 mg by mouth daily.    [provider]  butalbital-acetaminophen-caffeine (FIORICET, ESGIC) [phone] MG tablet Take 1-2 tablets by mouth every 6 (six) hours as needed for headache. 06/22/16 06/22/17  Minna Antis, MD  dicyclomine (BENTYL) 10 MG capsule Take 10 mg by mouth 3 (three) times daily between meals as needed for spasms.    [provider]  gabapentin (NEURONTIN) 300 MG capsule Take 300 mg by mouth 3 (three) times daily.    [provider]  hydrOXYzine (ATARAX/VISTARIL) 25 MG tablet Take 25 mg by mouth 3 (three) times daily as needed.    [provider]  lisinopril-hydrochlorothiazide (PRINZIDE,ZESTORETIC) 20-25 MG tablet Take 1 tablet by mouth daily.    [provider]  meloxicam (MOBIC) 7.5 MG tablet Take 7.5 mg by mouth daily.    [provider]  nitrofurantoin (MACRODANTIN) 100 MG capsule Take 1 capsule (100 mg total) by mouth 2 (two) times daily. Patient not taking: Reported on 04/06/2015 10/05/14   Sharman Cheek, MD  ondansetron [facility] ODT) 8 MG disintegrating tablet Take 1 tablet (8  mg total) by mouth every 8 (eight) hours as needed for nausea or vomiting. 10/05/14   Sharman CheekStafford, Phillip, MD  pantoprazole (PROTONIX) 40 MG tablet Take 40 mg by mouth daily.    [provider]  QUEtiapine (SEROQUEL) 100 MG tablet Take 100 mg by mouth at bedtime.    [provider]  ranitidine (ZANTAC) 150 MG capsule Take 1 capsule (150 mg total) by mouth 2 (two) times daily. Patient not taking: Reported on 04/06/2015 10/05/14   Sharman CheekStafford, Phillip, MD  sucralfate (CARAFATE)  1 g tablet Take 1 g by mouth 4 (four) times daily. Reported on 04/06/2015    [provider]    Allergies Vicodin [hydrocodone-acetaminophen]  Family History  Problem Relation Age of Onset  . Hypertension Mother   . Cancer Maternal Grandfather        pancreas    Social History Social History  Substance Use Topics  . Smoking status: Never Smoker  . Smokeless tobacco: Never Used  . Alcohol use No    Review of Systems  Constitutional: No fever/chills Eyes: No visual changes. ENT: No sore throat. Cardiovascular: Positive for chest pain. Respiratory: Positive for shortness of breath. Gastrointestinal: No nausea, no vomiting.  No diarrhea.  Genitourinary: Negative for dysuria, hematuria or flank pain.  Musculoskeletal: Negative for back pain. Skin: Negative for rash. Neurological: Negative for headache.   ____________________________________________   PHYSICAL EXAM:  VITAL SIGNS: ED Triage Vitals  Enc Vitals Group     BP 12/03/16 1037 (!) 191/128     Pulse Rate 12/03/16 1037 86     Resp 12/03/16 1037 18     Temp 12/03/16 1037 (!) 97.5 F (36.4 C)     Temp Source 12/03/16 1037 Oral     SpO2 12/03/16 1037 98 %     Weight 12/03/16 1050 260 lb (117.9 kg)     Height 12/03/16 1050 5\' 3"  (1.6 m)     Head Circumference --      Peak Flow --      Pain Score 12/03/16 1050 7     Pain Loc --      Pain Edu? --      Excl. in GC? --     Constitutional: Alert and oriented. Slightly uncomfortable but not ill appearing.  Eyes: Conjunctivae are normal.  Head: Atraumatic. Nose: No congestion/rhinnorhea. Mouth/Throat: Mucous membranes are moist.   Neck: Normal range of motion.  Cardiovascular: Normal rate, regular rhythm. Grossly normal heart sounds.  Good peripheral circulation. Respiratory: Normal respiratory effort.  No retractions. Lungs CTAB. Gastrointestinal: Soft and nontender. No distention.  Genitourinary: No CVA tenderness. Musculoskeletal: No lower  extremity edema.  Extremities warm and well perfused.  Neurologic:  Normal speech and language. No gross focal neurologic deficits are appreciated.  5/5 motor strength and intact sensation to all extremities, equal bilat.  CNs III-XII intact. Skin:  Skin is warm and dry. No rash noted. Psychiatric: Mood and affect are normal. Speech and behavior are normal.  ____________________________________________   LABS (all labs ordered are listed, but only abnormal results are displayed)  Labs Reviewed  COMPREHENSIVE METABOLIC PANEL - Abnormal; Notable for the following:       Result Value   Potassium 3.4 (*)    AST 85 (*)    ALT 107 (*)    All other components within normal limits  URINALYSIS, COMPLETE (UACMP) WITH MICROSCOPIC - Abnormal; Notable for the following:    Color, Urine YELLOW (*)    APPearance HAZY (*)  Glucose, UA 50 (*)    Hgb urine dipstick LARGE (*)    Ketones, ur 5 (*)    Protein, ur 30 (*)    Leukocytes, UA MODERATE (*)    Squamous Epithelial / LPF 6-30 (*)    All other components within normal limits  FIBRIN DERIVATIVES D-DIMER ([facility] ONLY) - Abnormal; Notable for the following:    Fibrin derivatives D-dimer ([facility]) 637.90 (*)    All other components within normal limits  CBC WITH DIFFERENTIAL/PLATELET  TROPONIN I  POC URINE PREG, ED  POCT PREGNANCY, URINE   ____________________________________________  EKG  ED ECG REPORT I, Dionne Bucy, the attending physician, personally viewed and interpreted this ECG.  Date: 12/03/2016 EKG Time: 1036 Rate: 76 Rhythm: normal sinus rhythm QRS Axis: normal Intervals: normal ST/T Wave abnormalities: LVH, otherwise normal Narrative Interpretation: no evidence of acute ischemia; no significant change when compared to EKG of 06/23/2016  ____________________________________________  RADIOLOGY  CXR: No focal infiltrate or other acute findings.    ____________________________________________   PROCEDURES  Procedure(s) performed: No    Critical Care performed: No ____________________________________________   INITIAL IMPRESSION / ASSESSMENT AND PLAN / ED COURSE  Pertinent labs & imaging results that were available during my care of the patient were reviewed by me and considered in my medical decision making (see chart for details).  36 year old female with past medical history of hypertension, CHF, and other PMH as noted above presents with nonexertional, atypical left-sided chest pain as well as intermittent subjective numbness to the left arm and left leg over the last several days. Patient reports history of similar symptoms previously.    On review of past medical records in Epic, patient had visit to the ED on 06/22/2016 for similar presentation, and had negative CT chest at that time.  There is no echo report available Epic. Previous EKGs reviewed.  On exam, patient is slightly uncomfortable relatively well-appearing, and her blood pressure significantly elevated (210/130 during my examination), but exam otherwise with no focal findings. Neuro exam is normal and there is no focal deficit. EKG is nonischemic. Overall I suspect most likely benign causes such as musculoskeletal pain, nerve pain or radiculopathy, or GI cause such as GERD; given patient's comorbidities and risk factors we will obtain ACS workup as well as d-dimer to rule out PE. Will obtain labs to r/o end organ dysfunction from htn, and given antihypertensives.  Patient has no clinical signs suggesting acute CHF exacerbation. If negative workup and symptoms improved anticipate d/c home.     Clinical Course as of Dec 04 1534  Sat Dec 03, 2016  1516 UA consistent with acute UTI.  BMP unremarkable.  Troponin negative.  D-dimer elevated.   [SS]    Clinical Course User Index [SS] Dionne Bucy, MD    ----------------------------------------- 3:06 PM on  12/03/2016 -----------------------------------------  A pressure somewhat improved. Patient reports improved but persistent pain. D-dimer slightly elevated. We will obtain CT chest to rule out PE. If negative anticipate likely discharge home.    ----------------------------------------- 3:37 PM on 12/03/2016 -----------------------------------------  Pending results of CT chest. Patient signed out to Dr. Don Perking.  ____________________________________________   FINAL CLINICAL IMPRESSION(S) / ED DIAGNOSES  Final diagnoses:  Atypical chest pain  Urinary tract infection without hematuria, site unspecified      NEW MEDICATIONS STARTED DURING THIS VISIT:  New Prescriptions   No medications on file     Note:  This document was prepared using Dragon voice recognition software and may include unintentional  dictation errors.    Dionne Bucy, MD 12/03/16 1610    Dionne Bucy, MD 12/03/16 1537

## 2016-12-03 NOTE — ED Notes (Signed)
Patient transported to CT 

## 2016-12-03 NOTE — ED Provider Notes (Signed)
-----------------------------------------   5:26 PM on 12/03/2016 -----------------------------------------   Blood pressure (!) 143/91, pulse 72, temperature (!) 97.5 F (36.4 C), temperature source Oral, resp. rate 18, height 5\' 3"  (1.6 m), weight 117.9 kg (260 lb), last menstrual period 11/28/2016, SpO2 98 %.  Assuming care from Dr. Marisa SeverinSiadecki of [place] Harmon is a 36 y.o. female with a chief complaint of Chest Pain .    Please refer to H&P by previous MD for further details.  The current plan of care is to follow up results of CTA to rule out PE.   CT Angio Chest PE W and/or Wo Contrast (Final result)  Result time 12/03/16 16:05:46  Final result by Joellyn HaffPatel, Hetal P, MD (12/03/16 16:05:46)           Narrative:   CLINICAL DATA: Chest pain, gradual onset, duration for 3 days, mainly substernal and radiating into the left shoulder and arm, constant but intermittently more severe.  EXAM: CT ANGIOGRAPHY CHEST WITH CONTRAST  TECHNIQUE: Multidetector CT imaging of the chest was performed using the standard protocol during bolus administration of intravenous contrast. Multiplanar CT image reconstructions and MIPs were obtained to evaluate the vascular anatomy.  CONTRAST: Study 5 mL Isovue 370  COMPARISON: None.  FINDINGS: Cardiovascular: Satisfactory opacification of the pulmonary arteries to the segmental level. No evidence of pulmonary embolism. Normal heart size. No pericardial effusion. Thoracic aorta is normal in caliber.  Mediastinum/Nodes: No enlarged mediastinal, hilar, or axillary lymph nodes. Thyroid gland, trachea, and esophagus demonstrate no significant findings.  Lungs/Pleura: Lungs are clear. No pleural effusion or pneumothorax.  Upper Abdomen: No acute abnormality. Low attenuation of the liver as can be seen with hepatic steatosis.  Musculoskeletal: No chest wall abnormality. No acute or significant osseous findings.  Review of the MIP images  confirms the above findings.  IMPRESSION: 1. No evidence of pulmonary embolus. 2. No acute cardiopulmonary disease.   Electronically Signed By: Elige KoHetal Patel On: 12/03/2016 16:05           Patient will be discharged home with prescriptions and instructions left by Dr. Marisa SeverinSiadecki. Since patient reports that she has been having episodes of CP for several months, I will refer her to see Cardiology as well. Troponin is negative and EKG non ischemic. Constant pain for 3 days therefore don't believe patient needs serial enzymes. Discussed return precautions with patient.    Nita SickleVeronese, [place], MD 12/03/16 [phone]

## 2016-12-03 NOTE — Discharge Instructions (Addendum)
Return to the ER for new or worsening pain, constant pain, weakness, difficulty walking, confusion, severe headache, vision changes, difficulty breathing, fevers or chills, blood in the urine, or any other new or worsening symptoms that concern you. Follow-up with your primary care doctor within the next week. Take the antibiotic as prescribed and finish the full course.   You were seen for chest pain. Your workup today was reassuring. As I explained to you that does not mean that you do not have heart disease. You may need further evaluation to ensure you do not have a serious heart problem. Therefore it is imperative that you follow up with your doctor in 1-2 days for further evaluation.   When should you call for help?  Call 911 if: You passed out (lost consciousness) or if you feel dizzy. You have difficulty breathing. You have symptoms of a heart attack. These may include: Chest pain or pressure, or a strange feeling in your chest. Indigestion. Sweating. Shortness of breath. Nausea or vomiting. Pain, pressure, or a strange feeling in your back, neck, jaw, or upper belly or in one or both shoulders or arms. Lightheadedness or sudden weakness. A fast or irregular heartbeat. After you call 911, the operator may tell you to chew 1 adult-strength or 2 to 4 low-dose aspirin. Wait for an ambulance. Do not try to drive yourself.   Call your doctor today if: You have any trouble breathing. Your chest pain gets worse. You are dizzy or lightheaded, or you feel like you may faint. You are not getting better as expected. You are having new or different chest pain  How can you care for yourself at home? Rest until you feel better. Take your medicine exactly as prescribed. Call your doctor if you think you are having a problem with your medicine. Do not drive after taking a prescription pain medicine.

## 2016-12-03 NOTE — ED Triage Notes (Signed)
Pt reports recurrent chest pain, reports started 3 days ago centralized pain and shortness of breath pt denies any other symptoms pt talks in complete sentences no respiratory distress noted

## 2021-09-15 ENCOUNTER — Other Ambulatory Visit: Payer: Self-pay

## 2021-09-15 ENCOUNTER — Encounter (HOSPITAL_COMMUNITY): Payer: Self-pay

## 2021-09-15 ENCOUNTER — Emergency Department (HOSPITAL_COMMUNITY)
Admission: EM | Admit: 2021-09-15 | Discharge: 2021-09-15 | Disposition: A | Discharge status: Home / Self Care | Payer: Medicaid Other | Attending: Emergency Medicine | Admitting: Emergency Medicine

## 2021-09-15 ENCOUNTER — Emergency Department (HOSPITAL_COMMUNITY): Payer: Medicaid Other

## 2021-09-15 DIAGNOSIS — I110 Hypertensive heart disease with heart failure: Secondary | ICD-10-CM | POA: Diagnosis not present

## 2021-09-15 DIAGNOSIS — R079 Chest pain, unspecified: Secondary | ICD-10-CM | POA: Diagnosis present

## 2021-09-15 DIAGNOSIS — R778 Other specified abnormalities of plasma proteins: Secondary | ICD-10-CM | POA: Diagnosis not present

## 2021-09-15 DIAGNOSIS — R600 Localized edema: Secondary | ICD-10-CM | POA: Diagnosis not present

## 2021-09-15 DIAGNOSIS — R0789 Other chest pain: Secondary | ICD-10-CM | POA: Insufficient documentation

## 2021-09-15 DIAGNOSIS — E876 Hypokalemia: Secondary | ICD-10-CM | POA: Insufficient documentation

## 2021-09-15 DIAGNOSIS — Z79899 Other long term (current) drug therapy: Secondary | ICD-10-CM | POA: Insufficient documentation

## 2021-09-15 DIAGNOSIS — I509 Heart failure, unspecified: Secondary | ICD-10-CM | POA: Insufficient documentation

## 2021-09-15 LAB — BASIC METABOLIC PANEL
Anion gap: 4 — ABNORMAL LOW (ref 5–15)
BUN: 9 mg/dL (ref 6–20)
CO2: 25 mmol/L (ref 22–32)
Calcium: 10.1 mg/dL (ref 8.9–10.3)
Chloride: 108 mmol/L (ref 98–111)
Creatinine, Ser: 0.82 mg/dL (ref 0.44–1.00)
GFR, Estimated: >60 mL/min (ref >60)
Glucose, Bld: 104 mg/dL — ABNORMAL HIGH (ref 70–99)
Potassium: 3.4 mmol/L — ABNORMAL LOW (ref 3.5–5.1)
Sodium: 137 mmol/L (ref 135–145)

## 2021-09-15 LAB — CBC
HCT: 44.2 % (ref 36.0–46.0)
Hemoglobin: 14.6 g/dL (ref 12.0–15.0)
MCH: 29.6 pg (ref 26.0–34.0)
MCHC: 33 g/dL (ref 30.0–36.0)
MCV: 89.7 fL (ref 80.0–100.0)
Platelets: 276 10*3/uL (ref 150–400)
RBC: 4.93 MIL/uL (ref 3.87–5.11)
RDW: 12.9 % (ref 11.5–15.5)
WBC: 8.8 10*3/uL (ref 4.0–10.5)
nRBC: 0 % (ref 0.0–0.2)

## 2021-09-15 LAB — TROPONIN I (HIGH SENSITIVITY)
Troponin I (High Sensitivity): 23 ng/L — ABNORMAL HIGH (ref <18)
Troponin I (High Sensitivity): 27 ng/L — ABNORMAL HIGH (ref <18)

## 2021-09-15 LAB — D-DIMER, QUANTITATIVE: D-Dimer, Quant: 0.48 ug/mL-FEU (ref 0.00–0.50)

## 2021-09-15 LAB — I-STAT BETA HCG BLOOD, ED (MC, WL, AP ONLY): I-stat hCG, quantitative: <5 m[IU]/mL (ref <5)

## 2021-09-15 LAB — MAGNESIUM: Magnesium: 2.1 mg/dL (ref 1.7–2.4)

## 2021-09-15 MED ORDER — NITROGLYCERIN 0.4 MG SL SUBL: 0.4000 mg | SUBLINGUAL_TABLET | SUBLINGUAL | 0 refills | Status: AC | PRN

## 2021-09-15 MED ORDER — NITROGLYCERIN 0.4 MG SL SUBL
0.4000 mg | SUBLINGUAL_TABLET | SUBLINGUAL | Status: DC | PRN
Start: 2021-09-15 — End: 2021-09-15
  Administered 2021-09-15: 0.4 mg via SUBLINGUAL
  Filled 2021-09-15: qty 1

## 2021-09-15 MED ORDER — POTASSIUM CHLORIDE CRYS ER 20 MEQ PO TBCR
40.0000 meq | EXTENDED_RELEASE_TABLET | Freq: Once | ORAL | Status: AC
  Administered 2021-09-15: 40 meq via ORAL
  Filled 2021-09-15: qty 2

## 2021-09-15 NOTE — ED Triage Notes (Signed)
Pt presents with CP that started yesterday while at work. Pt works in a hot environment and does repetitive motion which pt states made the pain worse. Pt felt hot and lightheaded at onset. Pt has associated ShOB.

## 2021-09-15 NOTE — Discharge Instructions (Addendum)
Note your work-up today was consistent with elevation in your heart enzyme.  Although we saw no new EKG findings, there is still concerning for heart pathology causing her symptoms.  Although you declined admission to the hospital for observation, I have sent in an ambulatory referral for cardiology to see them as soon as possible.  I have attached their information to your discharge papers but they should call you within 2 to 3 days with appointment time.  Please do not hesitate to return to the emergency department for worrisome signs and symptoms we discussed become apparent.

## 2021-09-15 NOTE — ED Provider Notes (Signed)
[facility] EMERGENCY DEPARTMENT Provider Note   CSN: [id number] Arrival date & time: 09/15/21  1140     History  Chief Complaint  Patient presents with   Chest Pain    Andrea Harmon is a 41 y.o. female.   Chest Pain  41 year old female presents emergency department with complaints of chest pain.  She states the chest pain began approximately 8-8 30 last night when she was at work.  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  She notes associated shortness of breath.  She states that taking a deep breath makes the chest pain worse.  It is described as central and left-sided squeezing in nature with no radiation.  She notes persistence of symptoms since her onset with no waxing or waning in intensity.  She denies history of DVT/PE, ongoing therapy, recent surgery/immobilization, known cancer diagnosis.  She does note some lower extremity swelling but states it is close to her baseline.  She denies fever, chills, night sweats, abdominal pain, nausea/vomiting/diarrhea, urinary/vaginal symptoms, change in bowel habits.  Bipolar disorder, CHF, hypertension, Nash, migraine, GERD  Home Medications Prior to Admission medications   Medication Sig Start Date End Date Taking? Authorizing Provider  acetaminophen (TYLENOL) 500 MG tablet Take 1,000 mg by mouth 4 (four) times daily as needed for headache (pain).   Yes [provider]  amLODipine (NORVASC) 10 MG tablet Take 10 mg by mouth every morning. 08/24/21  Yes [provider]  ARIPiprazole (ABILIFY) 5 MG tablet Take 5 mg by mouth every morning. 08/24/21  Yes [provider]  buPROPion (WELLBUTRIN XL) 150 MG 24 hr tablet Take 150 mg by mouth every morning. 08/24/21  Yes [provider]  furosemide (LASIX) 20 MG tablet Take 20 mg by mouth every other day. 09/03/21  Yes [provider]  ibuprofen (ADVIL) 200 MG tablet Take 400 mg by  mouth 4 (four) times daily as needed for headache (pain).   Yes [provider]  losartan (COZAAR) 100 MG tablet Take 100 mg by mouth every morning. 08/24/21  Yes [provider]  nitroGLYCERIN (NITROSTAT) 0.4 MG SL tablet Place 1 tablet (0.4 mg total) under the tongue every 5 (five) minutes as needed for chest pain. 09/15/21  Yes Dion Saucier A, PA  oxybutynin (DITROPAN-XL) 5 MG 24 hr tablet Take 5 mg by mouth every morning. 08/24/21  Yes [provider]  pantoprazole (PROTONIX) 40 MG tablet Take 40 mg by mouth every morning.   Yes [provider]  potassium chloride (KLOR-CON) 10 MEQ tablet Take 10 mEq by mouth every other day. Same day as Lasix/furosemide 09/03/21  Yes [provider]  meloxicam (MOBIC) 15 MG tablet Take 15 mg by mouth daily. Patient not taking: Reported on 09/15/2021 09/13/21   [provider]      Allergies    Vicodin [hydrocodone-acetaminophen]    Review of Systems   Review of Systems  Cardiovascular:  Positive for chest pain.  All other systems reviewed and are negative.   Physical Exam Updated Vital Signs BP (!) 171/92   Pulse 70   Temp 98 F (36.7 C) (Oral)   Resp 17   Ht 5\' 3"  (1.6 m)   Wt 113.4 kg   LMP 09/07/2021   SpO2 100%   BMI 44.29 kg/m  Physical Exam Vitals and nursing note reviewed.  Constitutional:      General: She is not in acute distress.  Appearance: She is well-developed. She is obese. She is not ill-appearing.  HENT:     Head: Normocephalic and atraumatic.  Eyes:     Conjunctiva/sclera: Conjunctivae normal.  Cardiovascular:     Rate and Rhythm: Normal rate and regular rhythm.     Heart sounds: No murmur heard.    Comments: Radial and posterior tibial pulses full and intact bilaterally. Pulmonary:     Effort: Pulmonary effort is normal. No respiratory distress.     Breath sounds: Normal breath sounds. No wheezing, rhonchi or rales.  Chest:     Comments: Mild reproducibility of  tenderness on anterior chest wall.  No overlying skin abnormality noted. Abdominal:     Palpations: Abdomen is soft.     Tenderness: There is no abdominal tenderness. There is no guarding.  Musculoskeletal:        General: No swelling.     Cervical back: Neck supple.     Comments: 1+ pitting edema noted bilateral lower extremities.  Posterior tibial pulses full and intact bilaterally.  Skin:    General: Skin is warm and dry.     Capillary Refill: Capillary refill takes less than 2 seconds.  Neurological:     Mental Status: She is alert.  Psychiatric:        Mood and Affect: Mood normal.     ED Results / Procedures / Treatments   Labs (all labs ordered are listed, but only abnormal results are displayed) Labs Reviewed  BASIC METABOLIC PANEL - Abnormal; Notable for the following components:      Result Value   Potassium 3.4 (*)    Glucose, Bld 104 (*)    Anion gap 4 (*)    All other components within normal limits  TROPONIN I (HIGH SENSITIVITY) - Abnormal; Notable for the following components:   Troponin I (High Sensitivity) 23 (*)    All other components within normal limits  TROPONIN I (HIGH SENSITIVITY) - Abnormal; Notable for the following components:   Troponin I (High Sensitivity) 27 (*)    All other components within normal limits  CBC  D-DIMER, QUANTITATIVE  MAGNESIUM  I-STAT BETA HCG BLOOD, ED ([facility], [facility], [facility] ONLY)    EKG EKG Interpretation  Date/Time:  Wednesday September 15 2021 11:59:20 EDT Ventricular Rate:  67 PR Interval:  154 QRS Duration: 84 QT Interval:  382 QTC Calculation: 403 R Axis:   -9 Text Interpretation: Sinus rhythm with marked sinus arrhythmia Right atrial enlargement Minimal voltage criteria for LVH, may be normal variant ( R in aVL ) Baseline wander dropped beat Abnormal ECG Confirmed by Gerhard Munch [id number]) on 09/15/2021 2:52:16 PM  Radiology DG Chest 2 View  Result Date: 09/15/2021 CLINICAL DATA:  Chest pain EXAM: CHEST - 2 VIEW  COMPARISON:  None Available. FINDINGS: The heart size and mediastinal contours are within normal limits. Both lungs are clear. The visualized skeletal structures are unremarkable. IMPRESSION: No active cardiopulmonary disease. Electronically Signed   By: Larose Hires D.O.   On: 09/15/2021 12:19    Procedures Procedures    Medications Ordered in ED Medications  nitroGLYCERIN (NITROSTAT) SL tablet 0.4 mg (0.4 mg Sublingual Given 09/15/21 1510)  potassium chloride SA (KLOR-CON M) CR tablet 40 mEq (40 mEq Oral Given 09/15/21 1404)    ED Course/ Medical Decision Making/ A&P Clinical Course as of 09/15/21 1532  Wed Sep 15, 2021  1529 I discussed with patient regarding observation stay in the hospital given that she has chest pain and elevated troponin.  She promptly declined offer for admission in favor of going home with close outpatient follow-up.  She was deemed to have capacity. [CR]    Clinical Course User Index [CR] Wilnette Kales, PA                           Medical Decision Making Amount and/or Complexity of Data Reviewed Labs: ordered. Radiology: ordered.  Risk Prescription drug management.   This patient presents to the ED for concern of chest pain, this involves an extensive number of treatment options, and is a complaint that carries with it a high risk of complications and morbidity.  The differential diagnosis includes The emergent causes of chest pain include: Acute coronary syndrome, tamponade, pericarditis/myocarditis, aortic dissection, pulmonary embolism, tension pneumothorax, pneumonia, and esophageal rupture. other urgent/non-acute considerations include, but are not limited to: chronic angina, aortic stenosis, cardiomyopathy, mitral valve prolapse, pulmonary hypertension, aortic insufficiency, right ventricular hypertrophy, pleuritis, bronchitis, pneumothorax, tumor, gastroesophageal reflux disease (GERD), esophageal spasm, Mallory-Weiss syndrome, peptic ulcer disease,  pancreatitis, functional gastrointestinal pain, cervical or thoracic disk disease or arthritis, shoulder arthritis, costochondritis, subacromial bursitis, anxiety or panic attack, herpes zoster, breast disorders, chest wall tumors, thoracic outlet syndrome, mediastinitis.   Co morbidities that complicate the patient evaluation  See HPI   Additional history obtained:  Additional history obtained from EMR External records from outside source obtained and reviewed including prior CTA of chest from 12/03/2016 indicating no acute pulmonary embolism.   Lab Tests:  I Ordered, and personally interpreted labs.  The pertinent results include: Beta-hCG within normal range.  No leukocytosis.  No obvious anemia.  Mild decrease in potassium 3.4.  This was supplemented while emergency department with low magnesium.  No other electrolyte abnormality.  Renal function within normal range.  Initial troponin 23 with repeat 27.  D-dimer negative so further imaging studies to evaluate for pulmonary embolism deemed unnecessary at this time.   Imaging Studies ordered:  I ordered imaging studies including chest x-ray I independently visualized and interpreted imaging which showed  Chest x-ray: No acute cardiopulmonary process.  I agree with the radiologist interpretation   Cardiac Monitoring: / EKG:  The patient was maintained on a cardiac monitor.  I personally viewed and interpreted the cardiac monitored which showed an underlying rhythm of: Sinus rhythm of similar appearance to prior EKGs obtained with no acute indications of ischemia.   Consultations Obtained:  N/a   Problem List / ED Course / Critical interventions / Medication management  Chest pain I ordered medication including nitroglycerin for chest pain   Reevaluation of the patient after these medicines showed that the patient stayed the same I have reviewed the patients home medicines and have made adjustments as needed   Social  Determinants of Health:  Denies tobacco, cocaine, illicit drug use.   Test / Admission - Considered:  Chest pain Vitals signs significant for hypertension with a blood pressure 171/92.  Recommend close follow-up with PCP regarding elevation of blood pressure 1 emergency department.. Otherwise within normal range and stable throughout visit. Laboratory/imaging studies significant for: See above ACS still possibility given slight increase in second troponin.  D-dimer was obtained and is negative so further evaluation for pulmonary embolism deemed unnecessary at this time.  Doubt pneumonia, CHF exacerbation given lack of appropriate symptoms, clinical suspicion and radiographic findings.  Doubt pneumothorax.  Doubt aortic dissection.  Doubt pericarditis/myocarditis. Patient offered observation in the hospital and further work-up here but she promptly  declined wishing for outpatient follow-up with cardiology.  I discussed with her the risks of leaving and she knowledge understanding and still insisted on leaving.  She was deemed to have capacity.  Ambulatory referral for cardiology set up upon discharge. Worrisome signs and symptoms were discussed with the patient, and the patient acknowledged understanding to return to the ED if noticed. Patient was stable upon discharge.         Final Clinical Impression(s) / ED Diagnoses Final diagnoses:  Chest pain, unspecified type  Elevated troponin    Rx / DC Orders ED Discharge Orders          Ordered    Ambulatory referral to Cardiology  Status:  Canceled       Comments: If you have not heard from the Cardiology office within the next 72 hours please call [phone].   09/15/21 1522    Ambulatory referral to Cardiology       Comments: If you have not heard from the Cardiology office within the next 72 hours please call [phone].   09/15/21 1527    nitroGLYCERIN (NITROSTAT) 0.4 MG SL tablet  Every 5 min PRN        09/15/21 1531               Peter Garter, [place] 09/15/21 1532    Gerhard Munch, MD 09/15/21 [phone]

## 2021-09-16 ENCOUNTER — Ambulatory Visit: Payer: Medicaid Other | Admitting: Interventional Cardiology

## 2021-09-16 ENCOUNTER — Encounter: Payer: Self-pay | Admitting: Interventional Cardiology

## 2021-09-16 VITALS — BP 124/88 | HR 95 | Ht 63.0 in | Wt 250.6 lb

## 2021-09-16 DIAGNOSIS — R072 Precordial pain: Secondary | ICD-10-CM

## 2021-09-16 DIAGNOSIS — R0602 Shortness of breath: Secondary | ICD-10-CM | POA: Diagnosis not present

## 2021-09-16 DIAGNOSIS — I10 Essential (primary) hypertension: Secondary | ICD-10-CM | POA: Diagnosis not present

## 2021-09-16 NOTE — Progress Notes (Signed)
Cardiology Office Note   Date:  09/16/2021   ID:  Andrea Harmon, DOB 03/28/1980, MRN [id number]  PCP:  Evelene Croon, MD    No chief complaint on file.  Chest pain, shortness of breath  Wt Readings from Last 3 Encounters:  09/16/21 250 lb 9.6 oz (113.7 kg)  09/15/21 250 lb (113.4 kg)  12/03/16 260 lb (117.9 kg)       History of Present Illness: Andrea Harmon is a 41 y.o. female who is being seen today for the evaluation of chest pain, shortness of breath at the request of Evelene Croon, MD.   She was seen at [facility] on 7/10 for leg swelling.  CTA chest showed: ". No evidence of pulmonary embolism. Normal  heart size. No pericardial effusion. " LE u/s showed: "No evidence of DVT within the left lower extremity. " She was sent home.   She was seen in the emergency room yesterday.  Records show: "with complaints of chest pain.  She states the chest pain began approximately 8-8 30 last night when she was at work.  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  She notes associated shortness of breath.  She states that taking a deep breath makes the chest pain worse.  It is described as central and left-sided squeezing in nature with no radiation.  She notes persistence of symptoms since her onset with no waxing or waning in intensity.  She denies history of DVT/PE, ongoing therapy, recent surgery/immobilization, known cancer diagnosis.  She does note some lower extremity swelling but states it is close to her baseline.  She denies fever, chills, night sweats, abdominal pain, nausea/vomiting/diarrhea, urinary/vaginal symptoms, change in bowel habits.   Bipolar disorder, CHF, hypertension, Nash, migraine, GERD"  She had a flat troponin trend and negative D-dimer.  She was discharged from the emergency room.  No arrhythmia.  No abnormality on ECG.  She continues to "feel miserable" with chest pain and shortness of  breath.  Her pulse can go from 50-103.    She thinks her father had a pacemaker.  Denies :  Nitroglycerin use. Orthopnea. Palpitations. Paroxysmal nocturnal dyspnea. Syncope.    She was given NTG in the ER without relief.   No home BP readings recently.   Past Medical History:  Diagnosis Date   Ankle fracture, left    Asthma    NO INHALER CURRETNLY   Bipolar disorder (HCC)    CHF (congestive heart failure) (HCC)    Depression    GERD (gastroesophageal reflux disease)    Hypertension    Migraine    Nonalcoholic fatty liver disease    Obesity    Vitamin D deficiency     Past Surgical History:  Procedure Laterality Date   DILATION AND CURETTAGE OF UTERUS     dnc     ESOPHAGOGASTRODUODENOSCOPY (EGD) WITH PROPOFOL N/A 04/06/2015   Procedure: ESOPHAGOGASTRODUODENOSCOPY (EGD) WITH PROPOFOL;  Surgeon: Elnita Maxwell, MD;  Location: [facility] ENDOSCOPY;  Service: Endoscopy;  Laterality: N/A;   LAPAROSCOPY Right 09/30/2014   Procedure: LAPAROSCOPY OPERATIVE;  Surgeon: Nadara Mustard, MD;  Location: [facility] ORS;  Service: Gynecology;  Laterality: Right;   LYMPH GLAND EXCISION Left    MOUTH SURGERY     OVARIAN CYST REMOVAL Right 09/30/2014   Procedure: OVARIAN CYSTECTOMY;  Surgeon: Nadara Mustard, MD;  Location: [facility] ORS;  Service: Gynecology;  Laterality: Right;   TUBAL LIGATION  Current Outpatient Medications  Medication Sig Dispense Refill   acetaminophen (TYLENOL) 500 MG tablet Take 1,000 mg by mouth 4 (four) times daily as needed for headache (pain).     amLODipine (NORVASC) 10 MG tablet Take 10 mg by mouth every morning.     ARIPiprazole (ABILIFY) 5 MG tablet Take 5 mg by mouth every morning.     buPROPion (WELLBUTRIN XL) 150 MG 24 hr tablet Take 150 mg by mouth every morning.     furosemide (LASIX) 20 MG tablet Take 20 mg by mouth every other day.     ibuprofen (ADVIL) 200 MG tablet Take 400 mg by mouth 4 (four) times daily as needed for headache (pain).     losartan  (COZAAR) 100 MG tablet Take 100 mg by mouth every morning.     meloxicam (MOBIC) 15 MG tablet Take 15 mg by mouth daily.     nitroGLYCERIN (NITROSTAT) 0.4 MG SL tablet Place 1 tablet (0.4 mg total) under the tongue every 5 (five) minutes as needed for chest pain. 30 tablet 0   oxybutynin (DITROPAN-XL) 5 MG 24 hr tablet Take 5 mg by mouth every morning.     pantoprazole (PROTONIX) 40 MG tablet Take 40 mg by mouth every morning.     potassium chloride (KLOR-CON) 10 MEQ tablet Take 10 mEq by mouth every other day. Same day as Lasix/furosemide     No current facility-administered medications for this visit.    Allergies:   Vicodin [hydrocodone-acetaminophen]    Social History:  The patient  reports that she has never smoked. She has never used smokeless tobacco. She reports that she does not drink alcohol and does not use drugs.   Family History:  The patient's family history includes Cancer in her maternal grandfather; Hypertension in her mother.    ROS:  Please see the history of present illness.   Otherwise, review of systems are positive for CP,SHOB, fatigue.   All other systems are reviewed and negative.    PHYSICAL EXAM: VS:  BP 124/88   Pulse 95   Ht 5\' 3"  (1.6 m)   Wt 250 lb 9.6 oz (113.7 kg)   LMP 09/07/2021   SpO2 99%   BMI 44.39 kg/m  , BMI Body mass index is 44.39 kg/m. GEN: Well nourished, well developed, in no acute distress HEENT: normal Neck: no JVD, carotid bruits, or masses Cardiac: irregular; no murmurs, rubs, or gallops,no edema  Respiratory:  clear to auscultation bilaterally, normal work of breathing GI: soft, nontender, nondistended, + BS MS: no deformity or atrophy Skin: warm and dry, no rash Neuro:  Strength and sensation are intact Psych: euthymic mood, full affect   EKG:   The ekg ordered today demonstrates NSR, sinus arrhythmia   Recent Labs: 09/15/2021: BUN 9; Creatinine, Ser 0.82; Hemoglobin 14.6; Magnesium 2.1; Platelets 276; Potassium 3.4;  Sodium 137   Lipid Panel No results found for: "CHOL", "TRIG", "HDL", "CHOLHDL", "VLDL", "LDLCALC", "LDLDIRECT"   Other studies Reviewed: Additional studies/ records that were reviewed today with results demonstrating: .   ASSESSMENT AND PLAN:  CP: Some typical and some atypical features.  Given the marked sinus arrhythmia, CTA may be difficult.  We will plan for PET CT scan.  I think this may give the most accurate test in the setting of her obesity. SHOB: Check echocardiogram.  No clear abnormalities on exam. Morbid obesity: This is her biggest long-term issue.  I spoke about increasing walking in general.  Hopefully this will help  with weight loss. HTN: BP improved on recheck.  Continue current medications.  Low-salt diet.  Avoid processed foods.  Weight loss will also be beneficial.  Most recent cholesterol labs not available to me.  This should be screened at some point   Current medicines are reviewed at length with the patient today.  The patient concerns regarding her medicines were addressed.  The following changes have been made:  No change  Labs/ tests ordered today include:  No orders of the defined types were placed in this encounter.   Recommend 150 minutes/week of aerobic exercise Low fat, low carb, high fiber diet recommended  Disposition:   FU for cardiac tests   Signed, Lance Muss, MD  09/16/2021 11:50 AM    [facility] [address] [place], [place], [place]  [address] Phone: [phone]; Fax: [phone]

## 2021-09-16 NOTE — Patient Instructions (Addendum)
Medication Instructions:  Your physician recommends that you continue on your current medications as directed. Please refer to the Current Medication list given to you today.  . *If you need a refill on your cardiac medications before your next appointment, please call your pharmacy*   Lab Work: none If you have labs (blood work) drawn today and your tests are completely normal, you will receive your results only by: MyChart Message (if you have MyChart) OR A paper copy in the mail If you have any lab test that is abnormal or we need to change your treatment, we will call you to review the results.   Testing/Procedures: Your physician has requested that you have an echocardiogram. Echocardiography is a painless test that uses sound waves to create images of your heart. It provides your doctor with information about the size and shape of your heart and how well your heart's chambers and valves are working. This procedure takes approximately one hour. There are no restrictions for this procedure.   Dr Eldridge Dace recommends you have a cardiac PET scan   Follow-Up: At [facility], you and your health needs are our priority.  As part of our continuing mission to provide you with exceptional heart care, we have created designated Provider Care Teams.  These Care Teams include your primary Cardiologist (physician) and Advanced Practice Providers (APPs -  Physician Assistants and Nurse Practitioners) who all work together to provide you with the care you need, when you need it.  We recommend signing up for the patient portal called "MyChart".  Sign up information is provided on this After Visit Summary.  MyChart is used to connect with patients for Virtual Visits (Telemedicine).  Patients are able to view lab/test results, encounter notes, upcoming appointments, etc.  Non-urgent messages can be sent to your provider as well.   To learn more about what you can do with MyChart, go to  [email or web address].    Your next appointment:   Based on results  The format for your next appointment:   In Person  Provider:   Dr Eldridge Dace 1}    Other Instructions How to Prepare for Your Cardiac PET/CT Stress Test:  1. You may take all your normal medications the day of the test  2. Nothing to eat or drink, except water, 3 hours prior to arrival time.   NO caffeine/decaffeinated products, or chocolate 12 hours prior to arrival.  3. NO perfume, cologne or lotion  4. Total time is 1 to 2 hours; you may want to bring reading material for the waiting time.  5. Please report to Admitting at the [facility] Main Entrance 60 minutes early for your test.  [address] [place], [place] [address]    IF YOU THINK YOU MAY BE PREGNANT, OR ARE NURSING PLEASE INFORM THE TECHNOLOGIST.  In preparation for your appointment, medication and supplies will be purchased.  Appointment availability is limited, so if you need to cancel or reschedule, please call the Radiology Department at [phone]  24 hours in advance to avoid a cancellation fee of $100.00  What to Expect After you Arrive:  Once you arrive and check in for your appointment, you will be taken to a preparation room within the Radiology Department.  A technologist or Nurse will obtain your medical history, verify that you are correctly prepped for the exam, and explain the procedure.  Afterwards,  an IV will be started in your arm and electrodes will be placed on your skin  for EKG monitoring during the stress portion of the exam. Then you will be escorted to the PET/CT scanner.  There, staff will get you positioned on the scanner and obtain a blood pressure and EKG.  During the exam, you will continue to be connected to the EKG and blood pressure machines.  A small, safe amount of a radioactive tracer will be injected in your IV to obtain a series of pictures of your heart along with an injection of a stress agent.     After your Exam:  It is recommended that you eat a meal and drink a caffeinated beverage to counter act any effects of the stress agent.  Drink plenty of fluids for the remainder of the day and urinate frequently for the first couple of hours after the exam.  Your doctor will inform you of your test results within 7-10 business days.  For questions about your test or how to prepare for your test, please call: Rockwell Alexandria, Cardiac Imaging Nurse Navigator  Larey Brick, Cardiac Imaging Nurse Navigator Office: [phone]   Important Information About Sugar

## 2021-09-21 ENCOUNTER — Other Ambulatory Visit (HOSPITAL_COMMUNITY): Payer: Medicaid Other

## 2021-09-23 ENCOUNTER — Ambulatory Visit (HOSPITAL_COMMUNITY): Payer: Medicaid Other | Attending: Interventional Cardiology

## 2021-09-23 DIAGNOSIS — R072 Precordial pain: Secondary | ICD-10-CM | POA: Diagnosis present

## 2021-09-23 LAB — ECHOCARDIOGRAM COMPLETE
Area-P 1/2: 4.15 cm2
P 1/2 time: 851 msec
S' Lateral: 3.8 cm

## 2021-09-27 ENCOUNTER — Encounter: Payer: Self-pay | Admitting: Interventional Cardiology

## 2021-09-27 NOTE — Telephone Encounter (Signed)
-----   Message from Corky Crafts, MD sent at 09/24/2021  5:52 PM EDT ----- Overall normal LV/RV/valvular function.  Impaired relaxation of the LV.  Continue with good blood pressure control.

## 2021-09-27 NOTE — Telephone Encounter (Signed)
Left message for patient to call back. Will also send results through MyChart

## 2021-10-11 ENCOUNTER — Telehealth (HOSPITAL_COMMUNITY): Payer: Self-pay | Admitting: *Deleted

## 2021-10-11 NOTE — Telephone Encounter (Signed)
Reaching out to patient to offer assistance regarding upcoming cardiac imaging study; pt verbalizes understanding of appt date/time, parking situation and where to check in, pre-test NPO status  and verified current allergies; name and call back number provided for further questions should they arise  Bevin Das RN Navigator Cardiac Imaging [facility] [phone] office [phone] cell  Patient aware to avoid caffeine 12 hours prior to her cardiac PET scan. 

## 2021-10-12 ENCOUNTER — Ambulatory Visit (HOSPITAL_COMMUNITY)
Admission: RE | Admit: 2021-10-12 | Discharge: 2021-10-12 | Disposition: A | Discharge status: Home / Self Care | Payer: Medicaid Other | Source: Ambulatory Visit | Attending: Interventional Cardiology | Admitting: Interventional Cardiology

## 2021-10-12 DIAGNOSIS — R072 Precordial pain: Secondary | ICD-10-CM | POA: Diagnosis not present

## 2021-10-12 LAB — NM PET CT CARDIAC PERFUSION MULTI W/ABSOLUTE BLOODFLOW
LV dias vol: 150 mL (ref 46–106)
LV sys vol: 87 mL
MBFR: 2.32
Nuc Rest EF: 38 %
Nuc Stress EF: 42 %
Rest MBF: 0.97 ml/g/min
Rest Nuclear Isotope Dose: 29.3 mCi
ST Depression (mm): 0 mm
Stress MBF: 2.25 ml/g/min
Stress Nuclear Isotope Dose: 29.6 mCi

## 2021-10-12 MED ORDER — RUBIDIUM RB82 GENERATOR (RUBYFILL)
25.0000 | PACK | Freq: Once | INTRAVENOUS | Status: AC
  Administered 2021-10-12: 25 via INTRAVENOUS

## 2021-10-12 MED ORDER — REGADENOSON 0.4 MG/5ML IV SOLN
INTRAVENOUS | Status: AC
  Administered 2021-10-12: 0.4 mg via INTRAVENOUS
  Filled 2021-10-12: qty 5

## 2021-10-12 MED ORDER — REGADENOSON 0.4 MG/5ML IV SOLN: 0.4000 mg | Freq: Once | INTRAVENOUS | Status: AC

## 2021-10-12 NOTE — Progress Notes (Signed)
Patient presents with for a cardiac PET stress test and tolerated the procedure without incident.  During her visit, pt had an elevated BP in the 180's/190's. Patient did not report any symptoms and states she forgot to take her BP meds this morning. Reviewed Dr. Jacques Navy and pt was ok to leave after test but was encouraged to take her BP meds when she got home and to re-check her BP. She was also encouraged to seek medical attention if her BP did not come down.

## 2022-05-05 DIAGNOSIS — I351 Nonrheumatic aortic (valve) insufficiency: Secondary | ICD-10-CM
